# Patient Record
Sex: Female | Born: 1990 | Hispanic: Yes | Marital: Married | Smoking: Never smoker
Health system: Southern US, Community
[De-identification: ages and names within clinical notes are randomized; demographics above are authoritative.]

## PROBLEM LIST (undated history)

## (undated) DIAGNOSIS — A749 Chlamydial infection, unspecified: Secondary | ICD-10-CM

## (undated) DIAGNOSIS — K219 Gastro-esophageal reflux disease without esophagitis: Secondary | ICD-10-CM

## (undated) DIAGNOSIS — F419 Anxiety disorder, unspecified: Secondary | ICD-10-CM

## (undated) HISTORY — DX: Anxiety disorder, unspecified: F41.9

## (undated) HISTORY — PX: LAPAROSCOPIC GASTRIC BAND REMOVAL WITH LAPAROSCOPIC GASTRIC SLEEVE RESECTION: SHX6498

## (undated) HISTORY — PX: CHOLECYSTECTOMY: SHX55

---
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
## 2008-09-02 IMAGING — US US ABDOMEN COMPLETE
1 series · 14 of 25 positions shown · non-contrast
Comparison: None

CLINICAL DATA: Mid abdominal pain, vomiting

ABDOMEN ULTRASOUND
TECHNIQUE: Complete abdominal ultrasound examination was performed
including evaluation of the liver, gallbladder, bile ducts,
pancreas, kidneys, spleen, IVC, and abdominal aorta.

[Series 1: us abdomen complete · 0.32mm/px · 14 of 70 slices shown]
[im 1/70]
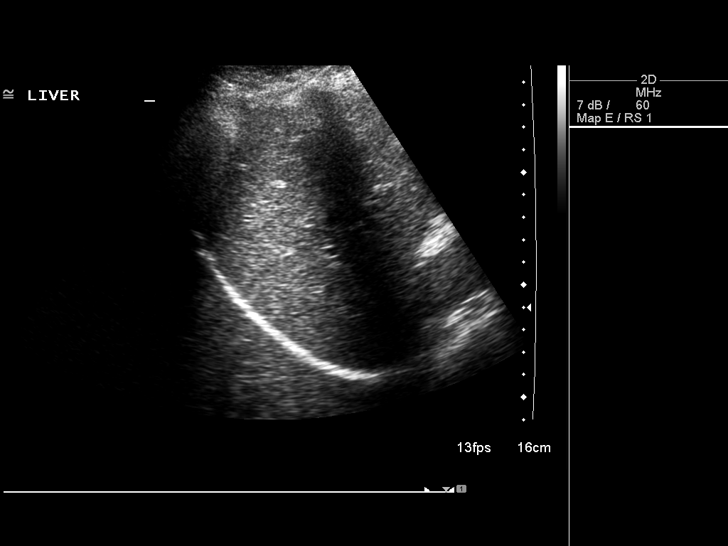
[im 6/70]
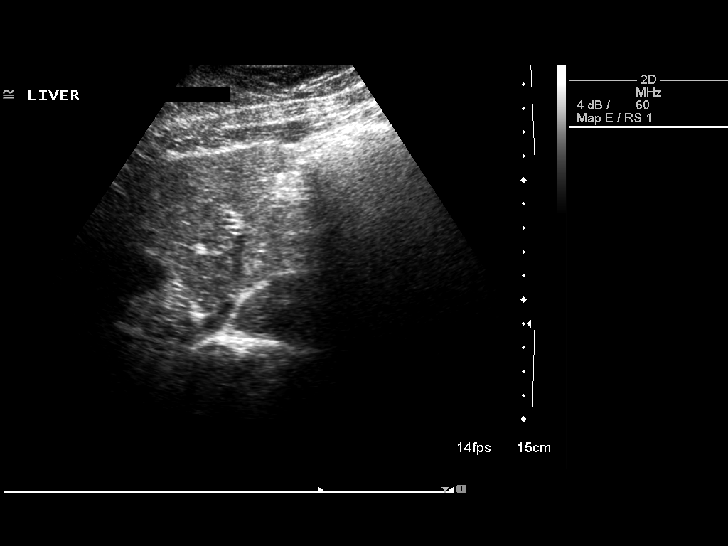
[im 12/70]
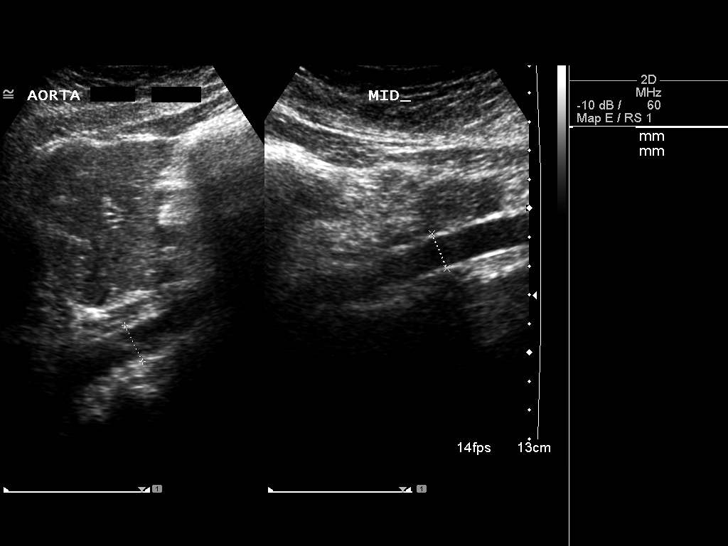
[im 18/70]
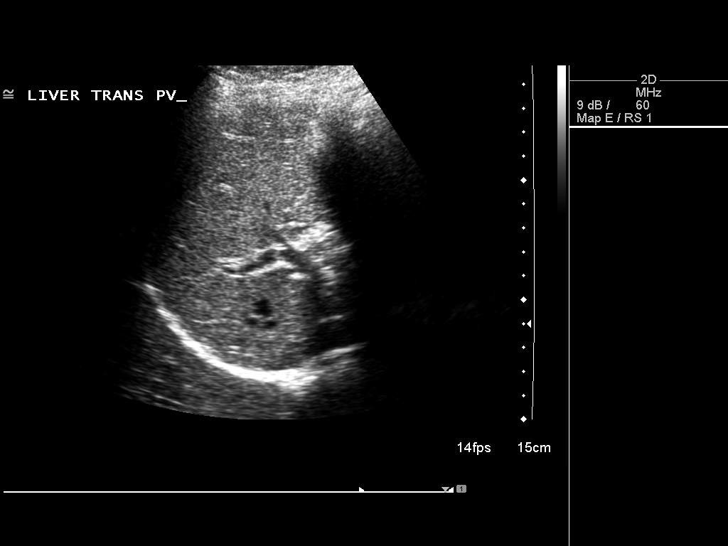
[im 24/70]
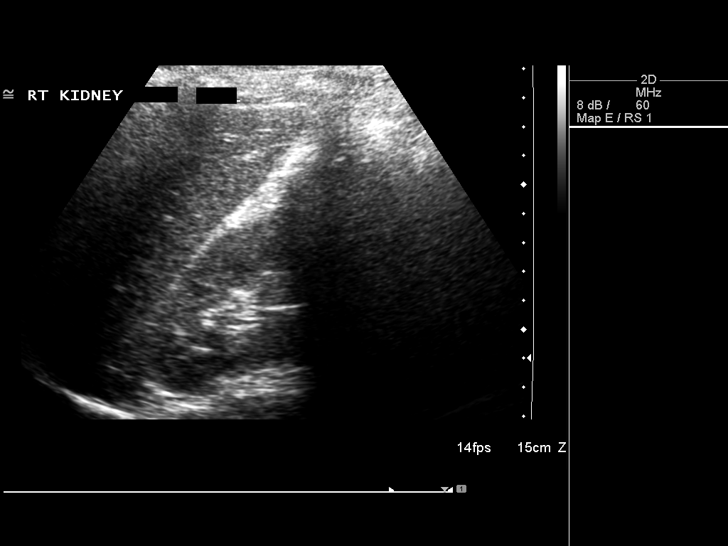
[im 26/70]
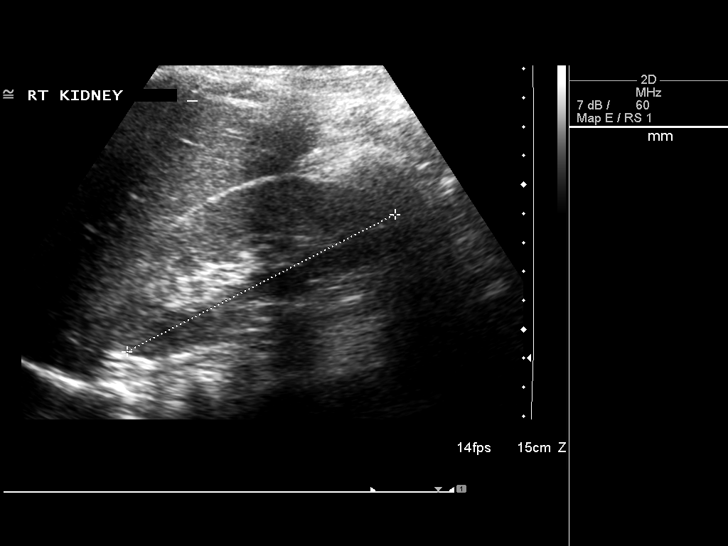
[im 32/70]
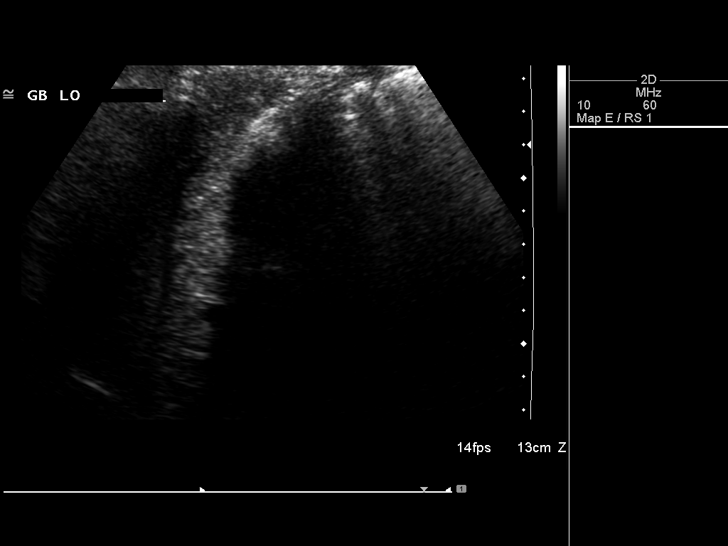
[im 38/70]
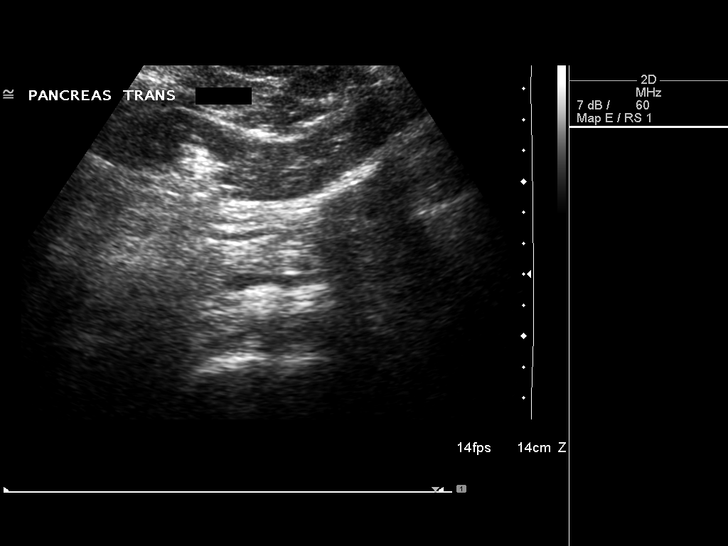
[im 44/70]
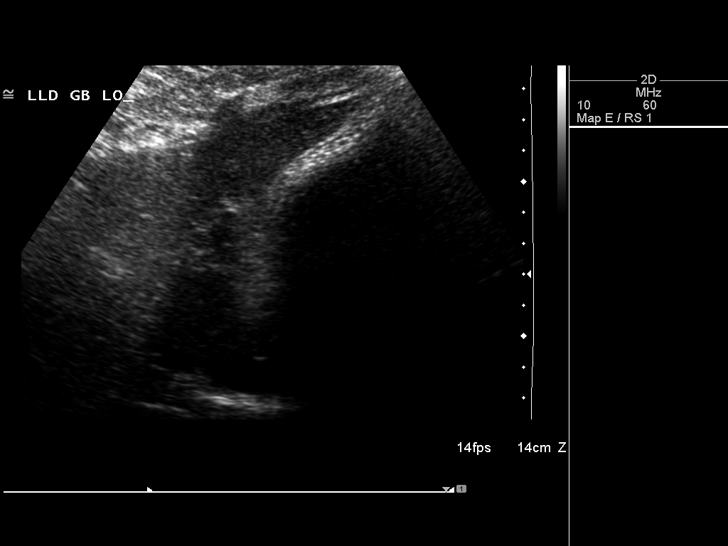
[im 47/70]
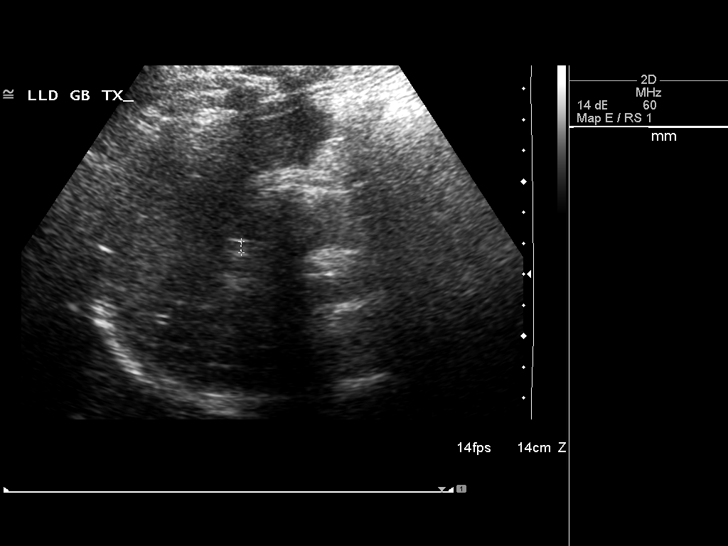
[im 52/70]
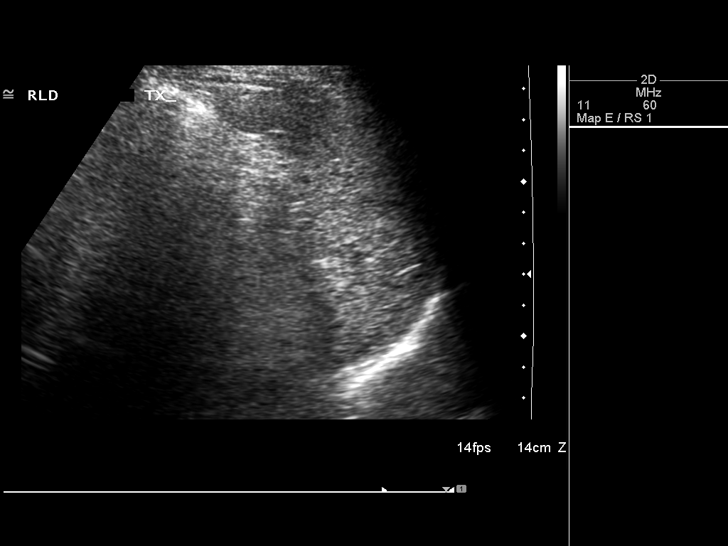
[im 58/70]
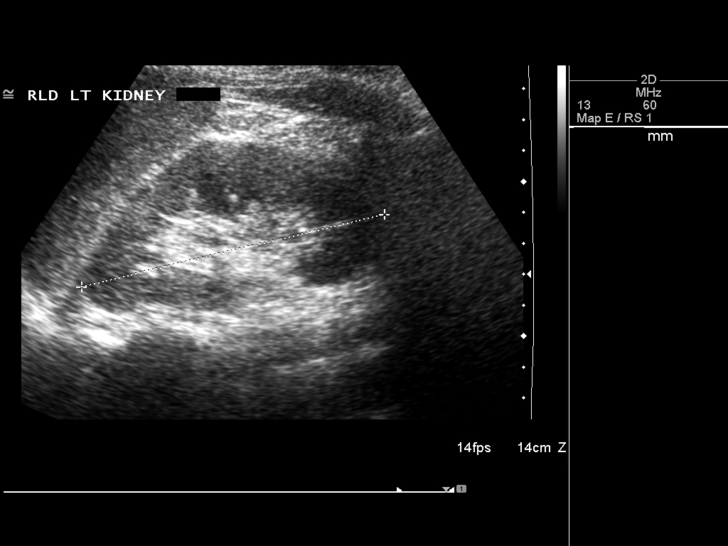
[im 64/70]
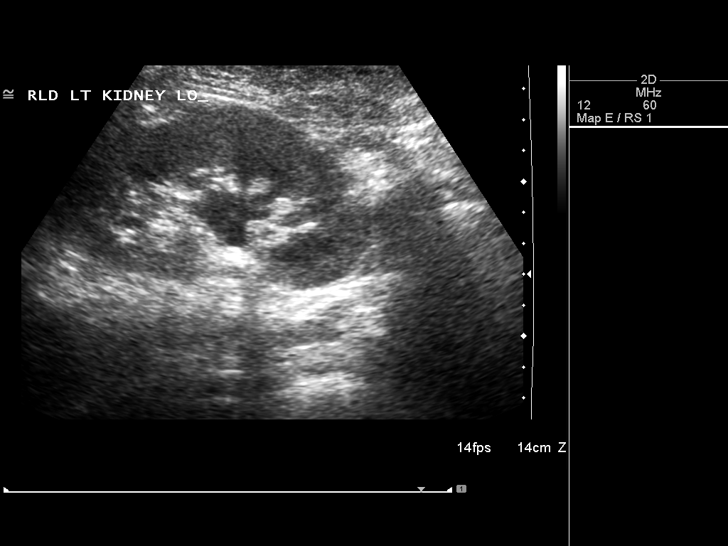
[im 70/70]
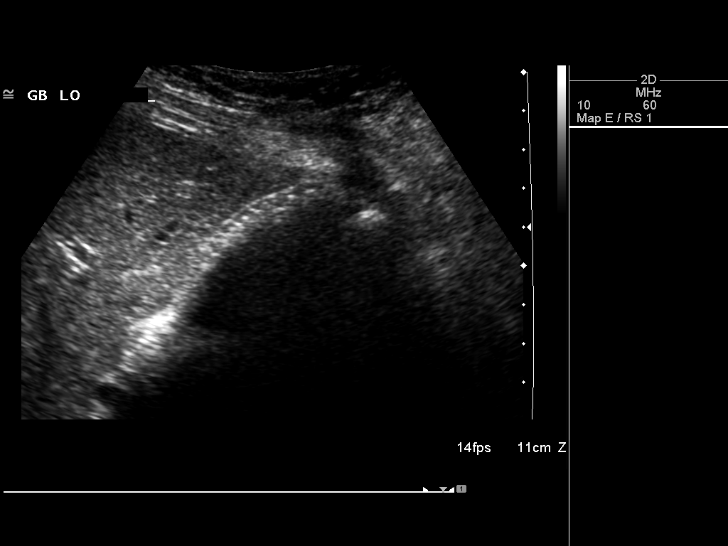

[14 of 25 positions shown; findings below may reference images not displayed]

FINDINGS: Gallbladder is filled with gallstones.  Gallbladder wall
difficult to visualize, but approximately 3 mm in thickness.
Common bile duct normal caliber 3 mm.

Limited visualization of the IVC, pancreas, and abdominal aorta due
to bowel gas.  Visualized liver, spleen, and kidneys unremarkable.
Slight prominence of the left renal pelvis, likely extrarenal
pelvis.
IMPRESSION: Gallbladder filled with gallstones, with borderline thickened wall.

## 2010-08-27 ENCOUNTER — Ambulatory Visit: Payer: Self-pay | Admitting: Gynecology

## 2011-01-21 NOTE — Op Note (Signed)
NAMEJAIANA, SHEFFER NO.:  [id number]   MEDICAL RECORD NO.:  [id number]           PATIENT TYPE:   LOCATION:                                 FACILITY:   PHYSICIAN:  Ollen Gross. Vernell Morgans, M.D. DATE OF BIRTH:  11/05/1990   DATE OF PROCEDURE:  12/18/2007  DATE OF DISCHARGE:                               OPERATIVE REPORT   PREOPERATIVE DIAGNOSIS:  Gallstone pancreatitis.   POSTOPERATIVE DIAGNOSIS:  Gallstone pancreatitis with retained common  duct stone.   PROCEDURE:  Laparoscopic cholecystectomy with intraoperative  cholangiogram.   SURGEON:  Ollen Gross. Vernell Morgans, M.D.   ASSISTANT:  Adolph Pollack, M.D.   ANESTHESIA:  General endotracheal.   DESCRIPTION OF PROCEDURE:  After informed consent was obtained, the  patient was brought to the operating and placed in supine position on  the operating room table.  After induction of general anesthesia, the  patient's abdomen was prepped with Betadine and draped in the usual  sterile manner.  The area below the umbilicus was infiltrated with 0.25%  Marcaine.  A small incision was made with a 15-blade knife.  This  incision was carried down through the subcutaneous tissue bluntly with a  hemostat and [employer] retractors until the linea alba was identified.  The linea alba was incised with a 15-blade knife, and each side was  grasped Kocher clamps and elevated anteriorly.  The preperitoneal space  was then probed bluntly with a hemostat until the peritoneum was opened  and access was gained to the abdominal cavity.  A 0 Vicryl pursestring  stitch was placed in the fascia around the opening, and Hasson cannula  was placed through the opening and anchored in place to the previously  placed Vicryl pursestring stitch.  The abdomen was then insufflated with  carbon dioxide without difficulty.  The patient was placed in the  reverse Trendelenburg position and rotated with the right side up.  A  laparoscope was inserted through  the Hasson cannula, and the right upper  quadrant was inspected.  The dome of the gallbladder and liver were  readily identified.  Next, the epigastric region was infiltrated with  0.25% Marcaine.  A small incision was made with a 15-blade knife and a  10-mm port was placed bluntly through this incision into the abdominal  cavity under direct vision.  Sites were then chosen laterally on the  right side of the area of placement of 5-mm ports.  There areas were  infiltrated with 0.25% Marcaine.  Small stab incisions were made with a  15-blade knife.  The 5-mm ports were then placed bluntly through these  incisions into the abdominal cavity under direct vision.  A blunt  grasper was placed through the lateral-most 5-mm port and used to grasp  the dome of the gallbladder and elevated anteriorly and superiorly.  Another blunt grasper was placed through the other 5-mm port and used to  retract on the body and neck of the gallbladder.  A dissector was placed  through the epigastric port, and using electrocautery, the peritoneal  reflection was opened at the gallbladder neck,  and blunt dissection was  then carried out in this area until the gallbladder neck-cystic duct  junction was readily identified and a good window was created.  A single  clip was placed on the gallbladder neck.  A small ductotomy and made  just below the clip.  A 14-gauge Angiocath was then placed  percutaneously through the anterior abdominal wall under direct vision.  A Reddick cholangiogram catheter was placed through the Angiocath and  flushed.  The Reddick catheter was then placed within the cystic duct  and anchored in place to the clip.  Cholangiogram was obtained that  showed distal common duct filling defect, obstructing flow into the  duodenum, and good length on the cystic duct.  The anchoring clip was  then removed from the patient.  Attempts were made to feed the Reddick  cholangiogram catheter down into the  common duct, but there were valves  that prevented this catheter from getting to there, and rather than  injure the common duct, we decided to remove the Reddick catheter.  Four  clips were placed proximally on the cystic duct, and duct was divided  between the two sets of clips.  Posterior to this, the cystic artery was  identified and again dissected bluntly in a circumferential manner until  a good window was created.  Two clips were placed proximally and  distally on the artery, and the artery was divided between the two.  Next, a laparoscopic hook cautery device was used to separate the  gallbladder from the liver bed.  Prior to completely detaching the  gallbladder from liver bed, the liver bed was inspected.  Several small  bleeding points were coagulated with electrocautery until the area was  completely hemostatic.  The gallbladder was then detached the rest away  from liver bed without difficulty.  A laparoscopic bag was inserted  through the epigastric port.  The gallbladder was placed in the bag, and  the bag was sealed.  The abdomen was then irrigated with copious amounts  of saline until the effluent was clear.  The laparoscope was then moved  to the epigastric port.  A gallbladder grasper was placed through the  Hasson cannula and used the grasper to open the bag.  The bag with the  gallbladder was removed through the infraumbilical port without  difficulty.  The fascial defect was closed with the previously placed  Vicryl pursestring stitch as well as with another figure-of-eight 0  Vicryl stitch.  The rest of ports were then removed under direct vision  and were found to be hemostatic.  The gas was allowed to escape.  The  skin incisions were all closed with interrupted 4-0 Monocryl  subcuticular stitches, and Dermabond dressings were applied.  The  patient tolerated the procedure well.  At the end of the case, all  needle, sponge, and instrument counts were correct.  The  patient was  then awakened and taken to recovery room in stable condition.      Ollen Gross. Vernell Morgans, M.D.  Electronically Signed     PST/MEDQ  D:  12/18/2007  T:  12/19/2007  Job:  [id number]

## 2011-01-21 NOTE — Consult Note (Signed)
[name], ELL NO.:  [id number]   MEDICAL RECORD NO.:  [id number]          PATIENT TYPE:  INP   LOCATION:  5158                         FACILITY:  [facility]   PHYSICIAN:  Ollen Gross. Vernell Morgans, M.D. DATE OF BIRTH:  [birth year]   DATE OF CONSULTATION:  12/18/2007  DATE OF DISCHARGE:                                 CONSULTATION   HISTORY OF PRESENT ILLNESS:  [name] is an 20 year old Hispanic  female with upper abdominal pain that began on Tuesday.  The pain has  been associated with nausea and vomiting, but no fevers.  She was  admitted 2 days ago with apparent gallstone pancreatitis, although, I do  not find evidence of pancreatitis on her lab work since being admitted  to the hospital.  She now feels a little bit better, but still has some  mild ache in her right flank area.  She denies any chest pain, shortness  of breath, diarrhea, or dysuria.   REVIEW OF SYSTEMS:  Rest of her review of system is unremarkable.   PAST MEDICAL HISTORY:  None.   PAST SURGICAL HISTORY:  None.   MEDICATIONS:  None.   ALLERGIES:  None.   SOCIAL HISTORY:  She denies use of alcohol or tobacco products.   FAMILY HISTORY:  Noncontributory.   PHYSICAL EXAMINATION:  GENERAL: She is a well-developed, well-nourished  Hispanic female, in no acute distress.  SKIN: Warm and dry with no jaundice.  EYES: Her extraocular muscle intact.  Pupils are equal, round, and  reactive to light.  Sclerae nonicteric.  LUNGS: Clear bilaterally with no use of accessory respiratory muscles.  HEART: Regular rate and rhythm with impulse in left chest.  ABDOMEN: Soft with some mild epigastric right upper quadrant tenderness,  but no palpable mass or hepatosplenomegaly.  EXTREMITIES: No cyanosis, clubbing, or edema.  Good strength in arms and  legs.  PSYCHOLOGICAL: She is alert and oriented x3 with no significant anxiety  or depression.   LABORATORY DATA:  On review of her lab work, her amylase and  lipase were  normal; white count was normal; and liver functions were okay.  On  review of her ultrasound, she did have stones in her gallbladder, but no  ductal dilatation.   ASSESSMENT AND PLAN:  This is a 20 year old Hispanic female with  possible gallstone pancreatitis and certainly some symptomatic  gallstones.  Because of the risk of further painful episodes and  possible pancreatitis, I  do not think she would benefit from cholecystectomy.  I discussed with  her in detail the risks and benefits of the operation to remove the  gallbladder as well as some of the technical aspects and she understands  and wished to proceed.  We will plan to do this for the next day or so  when the operation is scheduled, and we will also see.      Ollen Gross. Vernell Morgans, M.D.  Electronically Signed     PST/MEDQ  D:  12/18/2007  T:  12/19/2007  Job:  [id number]

## 2011-01-21 NOTE — H&P (Signed)
NAME:  Stacey Cole, LAZARD NO.:  [id number]   MEDICAL RECORD NO.:  [id number]          PATIENT TYPE:  EMS   LOCATION:  MAJO                         FACILITY:  [facility]   PHYSICIAN:  Herbie Saxon, MDDATE OF BIRTH:  1991/01/31   DATE OF ADMISSION:  12/17/2007  DATE OF DISCHARGE:  12/17/2007                              HISTORY & PHYSICAL   PRIMARY CARE PHYSICIAN:  Ursula Beath, MD from [facility].   She is a full code.   PRESENTING COMPLAINT:  Abdominal pain, nausea, and vomiting of 2 days  duration.   HISTORY OF PRESENT ILLNESS:  This is an 20 year old Hispanic lady, who  was seen by her primary care physician yesterday.  She had been  complaining of epigastric abdominal pain, 6-8/10, sharp, radiating to  the back, improved by leaning forward, and increased by eating.  She  vomited about 4-5 times prior to presentation.  She denies any fever or  any dysuria.  No hematemesis or melena stool.  No jaundice.  The patient  was sent by the primary care to have abdominal ultrasound, which did  show gallstones and inflamed pancreas, and lipase and LFTs were also  elevated.   PAST MEDICAL HISTORY:  Gastroesophageal reflux disease.   MEDICATIONS:  Prilosec OTC 20 mg daily.   ALLERGIES:  No known drug allergies.   FAMILY HISTORY:  Noncontributory.   PAST SURGICAL HISTORY:  Nil of note.   SOCIAL HISTORY:  The patient is single.   GYNECOLOGIC HISTORY:  Her last menstrual period is December 02, 2007.   REVIEW OF SYSTEMS:  Fourteen systems reviewed, pertinent and positive  other than history of presenting complaint.   PHYSICAL EXAMINATION:  GENERAL:  On examination, she is a young lady in  no acute distress.  VITAL SIGNS:  Temperature 98.4, pulse 80, respiratory rate is 20, and  blood pressure 120/70.  HEENT:  Pupils equally round and reactive to light and accommodation.  NECK:  Supple.  Oropharynx and nasopharynx are clear.  Mucous  membrane  are moist.  There is no carotid bruit.  No elevated thyroid.  No  submandibular lymphadenopathy.  CHEST:  Clinically clear.  HEART:  Sounds 1 and 2.  Regular rate and rhythm.  No murmurs.  ABDOMEN:  Mild epigastric tenderness.  No organomegaly.  Bowel sounds  are normoactive.  NEUROLOGIC:  He is alert and oriented to time, person, and place.  EXTREMITIES:  Power is 5+ globally.  Deep tendon reflexes are 2+  globally.  Peripheral pulses present.  No pedal edema.   LABS:  Pending.   ASSESSMENT:  Gallstone pancreatitis.   The patient is to be admitted to the medical floor.  She is to be NPO  until GI Surgery evaluate her in the a.m.  Surgeon will be Dr. Colin Benton.  IV fluids, D5 half normal saline at 80 mL an hour.  We will check  coagulation parameter, complete blood count, complete chemistry, chest x-  ray, EKG, urinalysis, and fasting lipids.  Lipase and amylase will also  be followed serially.  Oxygen 2-4 L by nasal cannula to keep  sats  greater than 90%.  The patient is also to be on SCD boots for DVT  prophylaxis.  Phenergan alternating Reglan p.r.n. for nausea.  Protonix  40 mg IV daily, loading 0.5-1 mg IV q. 4 h. p.r.n. for severe pain.      Herbie Saxon, MD  Electronically Signed     MIO/MEDQ  D:  12/17/2007  T:  12/18/2007  Job:  [id number]   cc:   Alfonse Ras, MD  Ursula Beath, MD

## 2011-01-21 NOTE — Consult Note (Signed)
[name], MASK NO.:  [id number]   MEDICAL RECORD NO.:  [id number]           PATIENT TYPE:   LOCATION:                                 FACILITY:   PHYSICIAN:  Stacey Cole, MDDATE OF BIRTH:  Oct 01, 1990   DATE OF CONSULTATION:  DATE OF DISCHARGE:                                 CONSULTATION   REASON FOR CONSULTATION:  Bile duct stone.   REQUESTING PHYSICIAN:  Dr. Carolynne Cole.   HISTORY OF PRESENT ILLNESS:  [name] is an 20 year old Hispanic  female who just came out of surgery for laparoscopic cholecystectomy  intraoperative cholangiogram.  During intraoperative cholangiogram, she  was found to have a common bile duct stone that could not be removed  during surgery.  She was admitted on December 17, 2007, secondary to  abdominal pain, nausea, vomiting of 2 days duration.  Her pain was  concerning for cholecystitis and she underwent an abdominal ultrasound  which did show gallstones elevated liver function tests and inflamed  pancreas.  On presentation here, her bilirubin was 0.8, ALP 124, AST 44,  ALT 123.  Her amylase 92 and lipase is 24.  Her abdominal pain prior to  admission was epigastric in nature, sharp, radiating in to her back  improved with leaning forward  and worsened with eating.  She also had  several episodes of vomiting prior to admission.  Currently she is in  post anesthesia care unit status post laparoscopic cholecystectomy and  intraoperative cholangiogram.   PAST MEDICAL HISTORY:  Reflux.   MEDICATIONS:  Prilosec OTC 20 mg p.o. daily   ALLERGIES:  No known drug allergies.   FAMILY HISTORY:  Noncontributory.   PHYSICAL EXAMINATION:  VITAL SIGNS:  Temperature 98.6, pulse  81, blood  pressure 113/71.  GENERAL:  Lethargic but arousable, in no acute distress.  ABDOMEN:  Tender, but only light palpation done due to recent surgery,  soft, mild distention, and positive bowel sounds.   LABORATORY:  Her white blood count 8.1,  hemoglobin 12.8, platelet counts  is 271,000.   IMPRESSION:  An 20 year old Hispanic female status post laparoscopic  cholecystectomy secondary to cholecystitis with possible intraoperative  cholangiogram for a common bile duct stone.  She is having endoscopic  retrograde cholangiopancreatography done to further evaluate and remove  any potential stones in the common bile duct.  I discussed risks,  benefits of the endoscopic retrograde cholangiopancreatogram with her  and she agrees to proceed.   PLAN:  This procedure will be on December 19, 2007, and will be done by Dr.  Everardo Cole. Stacey Cole.  Will follow LFTs in the morning.      Stacey Friar, MD  Electronically Signed     VCS/MEDQ  D:  12/18/2007  T:  12/19/2007  Job:  [id number]

## 2011-01-21 NOTE — Op Note (Signed)
Stacey Cole, HEBER NO.:  [id number]   MEDICAL RECORD NO.:  [id number]          PATIENT TYPE:  INP   LOCATION:  5158                         FACILITY:  [facility]   PHYSICIAN:  John C. Madilyn Fireman, M.D.    DATE OF BIRTH:  23-Aug-1991   DATE OF PROCEDURE:  12/19/2007  DATE OF DISCHARGE:                               OPERATIVE REPORT   PROCEDURE:  Endoscopic retrograde cholangiopancreatography with  sphincterotomy.   INDICATION FOR PROCEDURE:  Suspected common bile duct stone on  intraoperative cholangiogram.   PROCEDURE IN DETAIL:  The patient was placed in the left lateral  decubitus position and placed on the pulse monitor with continuous low-  flow oxygen delivered by nasal cannula.  She was sedated with 100 mcg IV  fentanyl and 7 mg IV Versed.  Olympus video side-viewing endoscope was  advanced blindly into the oropharynx, esophagus, and stomach.  The  pylorus was traversed and the papilla of Vater located on the medial  duodenal wall, had a normal appearance, was cannulated with Wilson-Cook  sphincterotome.  There was one in brief injection of the pancreatic duct  prior to selective cannulation of common bile duct.  The duct looked  moderately dilated but no filling defects were seen.  A sphincterotomy  was performed and a 15-mm balloon catheter advanced high into the common  hepatic duct and inflated and dragged through the papilla twice with no  stones seen to be delivered.  The scope was then withdrawn and the  patient returned to the recovery room in stable condition.  She  tolerated the procedure well.  There were no immediate complications.   IMPRESSION:  Mildly dilated common bile duct with no filling defects  seen, status post empiric sphincterotomy and balloon sweep.   PLAN:  Advance diet as tolerated and routine post surgical care.           ______________________________  Everardo All. Madilyn Fireman, M.D.     JCH/MEDQ  D:  12/19/2007  T:  12/20/2007  Job:   [id number]   cc:   Ollen Gross. Vernell Morgans, M.D.

## 2011-01-24 NOTE — Discharge Summary (Signed)
Stacey Cole, Stacey Cole NO.:  [id number]   MEDICAL RECORD NO.:  [id number]          PATIENT TYPE:  INP   LOCATION:  5158                         FACILITY:  [facility]   PHYSICIAN:  Herbie Saxon, MDDATE OF BIRTH:  Jul 17, 1991   DATE OF ADMISSION:  12/16/2007  DATE OF DISCHARGE:  12/20/2007                               DISCHARGE SUMMARY   DISCHARGE DIAGNOSES:  1. Cholelithiasis.  2. Common bile duct stone status post endoscopic retrograde      cholangiopancreatogram.  3. Cholecystitis.   CONSULTS:  1. Dr. Carolynne Edouard, General Surgery.  2. Dr. Bosie Clos, Gastroenterology.   PROCEDURE:  Cholecystectomy.   HOSPITAL COURSE:  This 20 year old female presented with abdominal pain,  heart burn, nausea, and vomiting of 2 days.  On presentation, her  transaminases were elevated as well as elevated lipase.  His abdominal  ultrasound showed gallstones, so we thought that the patient would  benefit from laparoscopic cholecystectomy, and this was performed on  December 18, 2007, without complications.  Surgery performed by Dr. Carolynne Edouard.  She was also seen by Dr. Bosie Clos of [facility].  An ERCP  was done.  It did not show any stone at that time with a 15-mm balloon  sweep performed.  The patient's elevated liver function tests and lipase  improved.  She was discharged on December 20, 2007, secondary to  laparoscopic cholecystectomy.   DISCHARGE CONDITION:  Stable.   DIET:  Low cholesterol.   ACTIVITY:  No restrictions.   FOLLOWUP:  Follow up with primary care physician Dr. Raquel James in 1 week.  Follow up with Dr. Carolynne Edouard again in 2-3 weeks.   MEDICATIONS ON DISCHARGE:  1. Prilosec 20 mg daily.  2. Vicodin 5/500 mg 1-2 tabs q.6 h. p.r.n.  3. Tylenol 650 mg q.6 h. p.r.n. for fever 1 tablet daily.   PHYSICAL EXAMINATION:  GENERAL:  She is a young girl in no acute  distress.  VITAL SIGNS:  Temperature 98, pulse 82, respiratory rate 18, and blood  pressure 100/70.  HEENT:   Pupils are equal and reactive to light and accommodation.  Extraocular muscles are intact.  Oropharynx and nasopharynx are clear.  NECK:  Supple.  ABDOMEN:  Surgical site in the right upper quadrant with  minimal  tenderness.  Bowel sounds are normoactive.  Inguinal orifices are  patent.  HEART:  Heart sounds 1 and 2 regular.  NEUROLOGIC:  She is alert and oriented to time, place, and person.  EXTREMITIES:  Peripheral pulses present.  No pedal edema.  NEUROLOGICAL:  Cranial nerves II through XII intact.  Power is 5 in all  limbs.  Deep tendon reflexes are 2+ in all limbs.   FOLLOWUP LABS:  AST 59, ALT 87, and lipase 17.      Herbie Saxon, MD  Electronically Signed     MIO/MEDQ  D:  01/18/2008  T:  01/19/2008  Job:  [id number]

## 2011-06-03 LAB — COMPREHENSIVE METABOLIC PANEL
AST: 44 — ABNORMAL HIGH
Albumin: 4.2
Alkaline Phosphatase: 124 — ABNORMAL HIGH
Chloride: 101
GFR calc Af Amer: >60
Potassium: 3.5
Sodium: 134 — ABNORMAL LOW
Total Bilirubin: 0.8
Total Protein: 7.7

## 2011-06-03 LAB — LIPID PANEL
Cholesterol: 145
LDL Cholesterol: 93
Total CHOL/HDL Ratio: 3.6
Triglycerides: 59
VLDL: 12

## 2011-06-03 LAB — CBC
HCT: 32 — ABNORMAL LOW
HCT: 36.4
Hemoglobin: 11 — ABNORMAL LOW
Hemoglobin: 12.8
MCHC: 34.4
MCHC: 35.3
MCV: 86.3
Platelets: 271
Platelets: 282
RBC: 3.7 — ABNORMAL LOW
RDW: 14.2
RDW: 14.4
WBC: 8.4

## 2011-06-03 LAB — HEPATIC FUNCTION PANEL
ALT: 87 — ABNORMAL HIGH
AST: 59 — ABNORMAL HIGH
Albumin: 3.3 — ABNORMAL LOW
Alkaline Phosphatase: 89
Bilirubin, Direct: 0.1
Bilirubin, Direct: 0.1
Indirect Bilirubin: 0.7
Total Bilirubin: 0.7
Total Bilirubin: 0.8

## 2011-06-03 LAB — AMYLASE: Amylase: 92

## 2011-06-03 LAB — BASIC METABOLIC PANEL
BUN: 6
CO2: 25
CO2: 26
Calcium: 8.5
Calcium: 9.4
Chloride: 102
GFR calc Af Amer: >60
GFR calc non Af Amer: >60
Glucose, Bld: 84
Glucose, Bld: 98
Potassium: 3.4 — ABNORMAL LOW
Potassium: 3.5
Sodium: 134 — ABNORMAL LOW

## 2011-06-03 LAB — HEPATITIS C ANTIBODY: HCV Ab: NEGATIVE

## 2011-06-03 LAB — TYPE AND SCREEN: Antibody Screen: NEGATIVE

## 2013-04-19 LAB — OB RESULTS CONSOLE HGB/HCT, BLOOD
HCT: 40 %
Hemoglobin: 13.8 g/dL

## 2013-04-19 LAB — OB RESULTS CONSOLE HIV ANTIBODY (ROUTINE TESTING): HIV: NONREACTIVE

## 2013-04-19 LAB — OB RESULTS CONSOLE RUBELLA ANTIBODY, IGM: RUBELLA: NON-IMMUNE/NOT IMMUNE

## 2013-04-19 LAB — OB RESULTS CONSOLE RPR: RPR: NONREACTIVE

## 2013-04-19 LAB — OB RESULTS CONSOLE GC/CHLAMYDIA: GC PROBE AMP, GENITAL: NEGATIVE

## 2013-04-19 LAB — OB RESULTS CONSOLE ABO/RH: RH TYPE: POSITIVE

## 2013-04-19 LAB — OB RESULTS CONSOLE PLATELET COUNT: PLATELETS: 352 10*3/uL

## 2013-04-19 LAB — OB RESULTS CONSOLE HEPATITIS B SURFACE ANTIGEN: Hepatitis B Surface Ag: NEGATIVE

## 2013-09-08 NOTE — L&D Delivery Note (Signed)
Delivery Note At 3:53 PM a viable female was delivered via Vaginal, Spontaneous Delivery (Presentation: ;  ).  APGAR: , ; weight .   Placenta status: Intact, Spontaneous.  Cord:loose nuchal X 1, reduced   with the following complications: .  Cord pH: sent, pending  Anesthesia:epid   Episiotomy: none Lacerations: sec deg Suture Repair: 3.0 vicryl rapide Est. Blood Loss (mL): 300  Mom to postpartum.  Baby to Nursery.  Meriel PicaHOLLAND,Avi Kerschner M 12/02/2013, 4:12 PM

## 2013-11-09 LAB — OB RESULTS CONSOLE GBS: GBS: NEGATIVE

## 2013-11-14 LAB — OB RESULTS CONSOLE GC/CHLAMYDIA
Chlamydia: POSITIVE
Chlamydia: POSITIVE

## 2013-12-02 ENCOUNTER — Encounter (HOSPITAL_COMMUNITY): Payer: Self-pay | Admitting: *Deleted

## 2013-12-02 ENCOUNTER — Inpatient Hospital Stay (HOSPITAL_COMMUNITY)
Admission: AD | Admit: 2013-12-02 | Discharge: 2013-12-04 | DRG: 775 | Disposition: A | Discharge status: Home / Self Care | Payer: BC Managed Care – PPO | Source: Ambulatory Visit | Attending: Obstetrics and Gynecology | Admitting: Obstetrics and Gynecology

## 2013-12-02 ENCOUNTER — Inpatient Hospital Stay (HOSPITAL_COMMUNITY): Payer: BC Managed Care – PPO | Admitting: Anesthesiology

## 2013-12-02 ENCOUNTER — Encounter (HOSPITAL_COMMUNITY): Payer: BC Managed Care – PPO | Admitting: Anesthesiology

## 2013-12-02 DIAGNOSIS — O99214 Obesity complicating childbirth: Secondary | ICD-10-CM

## 2013-12-02 DIAGNOSIS — K219 Gastro-esophageal reflux disease without esophagitis: Secondary | ICD-10-CM | POA: Diagnosis present

## 2013-12-02 DIAGNOSIS — Z3490 Encounter for supervision of normal pregnancy, unspecified, unspecified trimester: Secondary | ICD-10-CM

## 2013-12-02 DIAGNOSIS — IMO0001 Reserved for inherently not codable concepts without codable children: Secondary | ICD-10-CM

## 2013-12-02 DIAGNOSIS — E669 Obesity, unspecified: Secondary | ICD-10-CM | POA: Diagnosis present

## 2013-12-02 HISTORY — DX: Gastro-esophageal reflux disease without esophagitis: K21.9

## 2013-12-02 HISTORY — DX: Chlamydial infection, unspecified: A74.9

## 2013-12-02 LAB — CBC
HEMATOCRIT: 36.7 % (ref 36.0–46.0)
HEMOGLOBIN: 12.7 g/dL (ref 12.0–15.0)
MCH: 28.3 pg (ref 26.0–34.0)
MCHC: 34.6 g/dL (ref 30.0–36.0)
MCV: 81.7 fL (ref 78.0–100.0)
Platelets: 254 10*3/uL (ref 150–400)
RBC: 4.49 MIL/uL (ref 3.87–5.11)
RDW: 14.7 % (ref 11.5–15.5)
WBC: 10.4 10*3/uL (ref 4.0–10.5)

## 2013-12-02 LAB — RPR: RPR Ser Ql: NONREACTIVE

## 2013-12-02 LAB — TYPE AND SCREEN
ABO/RH(D): A POS
ANTIBODY SCREEN: NEGATIVE

## 2013-12-02 LAB — ABO/RH: ABO/RH(D): A POS

## 2013-12-02 MED ORDER — LACTATED RINGERS IV SOLN: 500.0000 mL | Freq: Once | INTRAVENOUS | Status: DC

## 2013-12-02 MED ORDER — WITCH HAZEL-GLYCERIN EX PADS: 1.0000 "application " | MEDICATED_PAD | CUTANEOUS | Status: DC | PRN

## 2013-12-02 MED ORDER — IBUPROFEN 600 MG PO TABS: 600.0000 mg | ORAL_TABLET | Freq: Four times a day (QID) | ORAL | Status: DC | PRN

## 2013-12-02 MED ORDER — EPHEDRINE 5 MG/ML INJ
INTRAVENOUS | Status: AC
  Filled 2013-12-02: qty 4

## 2013-12-02 MED ORDER — ZOLPIDEM TARTRATE 5 MG PO TABS: 5.0000 mg | ORAL_TABLET | Freq: Every evening | ORAL | Status: DC | PRN

## 2013-12-02 MED ORDER — LACTATED RINGERS IV SOLN: 500.0000 mL | INTRAVENOUS | Status: DC | PRN

## 2013-12-02 MED ORDER — PHENYLEPHRINE 40 MCG/ML (10ML) SYRINGE FOR IV PUSH (FOR BLOOD PRESSURE SUPPORT)
PREFILLED_SYRINGE | INTRAVENOUS | Status: DC
Start: 2013-12-02 — End: 2013-12-02
  Filled 2013-12-02: qty 10

## 2013-12-02 MED ORDER — CITRIC ACID-SODIUM CITRATE 334-500 MG/5ML PO SOLN: 30.0000 mL | ORAL | Status: DC | PRN

## 2013-12-02 MED ORDER — MEASLES, MUMPS & RUBELLA VAC ~~LOC~~ INJ: 0.5000 mL | INJECTION | Freq: Once | SUBCUTANEOUS | Status: DC

## 2013-12-02 MED ORDER — SERTRALINE HCL 50 MG PO TABS
50.0000 mg | ORAL_TABLET | Freq: Every day | ORAL | Status: DC
  Administered 2013-12-04: 50 mg via ORAL
  Filled 2013-12-02 (×3): qty 1

## 2013-12-02 MED ORDER — BUTORPHANOL TARTRATE 1 MG/ML IJ SOLN: 1.0000 mg | INTRAMUSCULAR | Status: DC | PRN

## 2013-12-02 MED ORDER — FENTANYL 2.5 MCG/ML BUPIVACAINE 1/10 % EPIDURAL INFUSION (WH - ANES): 14.0000 mL/h | INTRAMUSCULAR | Status: DC | PRN

## 2013-12-02 MED ORDER — LIDOCAINE HCL (PF) 1 % IJ SOLN
30.0000 mL | INTRAMUSCULAR | Status: DC | PRN
  Administered 2013-12-02: 30 mL via SUBCUTANEOUS
  Filled 2013-12-02: qty 30

## 2013-12-02 MED ORDER — SENNOSIDES-DOCUSATE SODIUM 8.6-50 MG PO TABS
2.0000 | ORAL_TABLET | ORAL | Status: DC
  Administered 2013-12-03 (×2): 2 via ORAL
  Filled 2013-12-02 (×2): qty 2

## 2013-12-02 MED ORDER — PRENATAL MULTIVITAMIN CH
1.0000 | ORAL_TABLET | Freq: Every day | ORAL | Status: DC
  Administered 2013-12-03 – 2013-12-04 (×2): 1 via ORAL
  Filled 2013-12-02 (×2): qty 1

## 2013-12-02 MED ORDER — PHENYLEPHRINE 40 MCG/ML (10ML) SYRINGE FOR IV PUSH (FOR BLOOD PRESSURE SUPPORT): 80.0000 ug | PREFILLED_SYRINGE | INTRAVENOUS | Status: DC | PRN

## 2013-12-02 MED ORDER — FENTANYL 2.5 MCG/ML BUPIVACAINE 1/10 % EPIDURAL INFUSION (WH - ANES)
INTRAMUSCULAR | Status: DC | PRN
  Administered 2013-12-02: 14 mL/h via EPIDURAL

## 2013-12-02 MED ORDER — ONDANSETRON HCL 4 MG/2ML IJ SOLN: 4.0000 mg | INTRAMUSCULAR | Status: DC | PRN

## 2013-12-02 MED ORDER — IBUPROFEN 800 MG PO TABS
800.0000 mg | ORAL_TABLET | Freq: Three times a day (TID) | ORAL | Status: DC | PRN
  Administered 2013-12-02 – 2013-12-04 (×4): 800 mg via ORAL
  Filled 2013-12-02 (×4): qty 1

## 2013-12-02 MED ORDER — BENZOCAINE-MENTHOL 20-0.5 % EX AERO
1.0000 "application " | INHALATION_SPRAY | CUTANEOUS | Status: DC | PRN
  Administered 2013-12-02: 1 via TOPICAL
  Filled 2013-12-02 (×2): qty 56

## 2013-12-02 MED ORDER — SIMETHICONE 80 MG PO CHEW: 80.0000 mg | CHEWABLE_TABLET | ORAL | Status: DC | PRN

## 2013-12-02 MED ORDER — LANOLIN HYDROUS EX OINT: TOPICAL_OINTMENT | CUTANEOUS | Status: DC | PRN

## 2013-12-02 MED ORDER — ACETAMINOPHEN 325 MG PO TABS
650.0000 mg | ORAL_TABLET | ORAL | Status: DC | PRN
Start: 2013-12-02 — End: 2013-12-02

## 2013-12-02 MED ORDER — OXYCODONE-ACETAMINOPHEN 5-325 MG PO TABS: 1.0000 | ORAL_TABLET | ORAL | Status: DC | PRN

## 2013-12-02 MED ORDER — OXYTOCIN 40 UNITS IN LACTATED RINGERS INFUSION - SIMPLE MED
62.5000 mL/h | INTRAVENOUS | Status: DC
  Filled 2013-12-02: qty 1000

## 2013-12-02 MED ORDER — OXYCODONE-ACETAMINOPHEN 5-325 MG PO TABS
1.0000 | ORAL_TABLET | Freq: Four times a day (QID) | ORAL | Status: DC | PRN
  Administered 2013-12-03: 1 via ORAL
  Filled 2013-12-02: qty 1

## 2013-12-02 MED ORDER — EPHEDRINE 5 MG/ML INJ: 10.0000 mg | INTRAVENOUS | Status: DC | PRN

## 2013-12-02 MED ORDER — TETANUS-DIPHTH-ACELL PERTUSSIS 5-2.5-18.5 LF-MCG/0.5 IM SUSP
0.5000 mL | Freq: Once | INTRAMUSCULAR | Status: DC
  Filled 2013-12-02: qty 0.5

## 2013-12-02 MED ORDER — DIPHENHYDRAMINE HCL 50 MG/ML IJ SOLN: 12.5000 mg | INTRAMUSCULAR | Status: DC | PRN

## 2013-12-02 MED ORDER — FLEET ENEMA 7-19 GM/118ML RE ENEM: 1.0000 | ENEMA | Freq: Every day | RECTAL | Status: DC | PRN

## 2013-12-02 MED ORDER — DIPHENHYDRAMINE HCL 25 MG PO CAPS: 25.0000 mg | ORAL_CAPSULE | Freq: Four times a day (QID) | ORAL | Status: DC | PRN

## 2013-12-02 MED ORDER — LACTATED RINGERS IV SOLN: INTRAVENOUS | Status: DC

## 2013-12-02 MED ORDER — DIBUCAINE 1 % RE OINT
1.0000 "application " | TOPICAL_OINTMENT | RECTAL | Status: DC | PRN
  Filled 2013-12-02: qty 28

## 2013-12-02 MED ORDER — FENTANYL 2.5 MCG/ML BUPIVACAINE 1/10 % EPIDURAL INFUSION (WH - ANES)
INTRAMUSCULAR | Status: AC
  Filled 2013-12-02: qty 125

## 2013-12-02 MED ORDER — LIDOCAINE HCL (PF) 1 % IJ SOLN
INTRAMUSCULAR | Status: DC | PRN
  Administered 2013-12-02 (×2): 4 mL

## 2013-12-02 MED ORDER — BISACODYL 10 MG RE SUPP
10.0000 mg | Freq: Every day | RECTAL | Status: DC | PRN
  Filled 2013-12-02: qty 1

## 2013-12-02 MED ORDER — OXYTOCIN BOLUS FROM INFUSION
500.0000 mL | INTRAVENOUS | Status: DC
  Administered 2013-12-02: 500 mL via INTRAVENOUS

## 2013-12-02 MED ORDER — ONDANSETRON HCL 4 MG PO TABS
4.0000 mg | ORAL_TABLET | ORAL | Status: DC | PRN
Start: 2013-12-02 — End: 2013-12-04

## 2013-12-02 MED ORDER — ONDANSETRON HCL 4 MG/2ML IJ SOLN: 4.0000 mg | Freq: Four times a day (QID) | INTRAMUSCULAR | Status: DC | PRN

## 2013-12-02 NOTE — MAU Note (Signed)
UC's since last night. No leaking or bleeding.

## 2013-12-02 NOTE — Anesthesia Preprocedure Evaluation (Signed)
Anesthesia Evaluation  Patient identified by MRN, date of birth, ID band Patient awake    Reviewed: Allergy & Precautions, H&P , Patient's Chart, lab work & pertinent test results  Airway Mallampati: III TM Distance: >3 FB Neck ROM: Full    Dental no notable dental hx. (+) Teeth Intact   Pulmonary neg pulmonary ROS,  breath sounds clear to auscultation  Pulmonary exam normal       Cardiovascular negative cardio ROS  Rhythm:Regular Rate:Normal     Neuro/Psych negative neurological ROS  negative psych ROS   GI/Hepatic Neg liver ROS, GERD-  Medicated and Controlled,  Endo/Other  Obesity  Renal/GU negative Renal ROS  negative genitourinary   Musculoskeletal negative musculoskeletal ROS (+)   Abdominal   Peds  Hematology negative hematology ROS (+)   Anesthesia Other Findings   Reproductive/Obstetrics (+) Pregnancy                           Anesthesia Physical Anesthesia Plan  ASA: II  Anesthesia Plan: Epidural   Post-op Pain Management:    Induction:   Airway Management Planned: Natural Airway  Additional Equipment:   Intra-op Plan:   Post-operative Plan:   Informed Consent: I have reviewed the patients History and Physical, chart, labs and discussed the procedure including the risks, benefits and alternatives for the proposed anesthesia with the patient or authorized representative who has indicated his/her understanding and acceptance.     Plan Discussed with: Anesthesiologist  Anesthesia Plan Comments:         Anesthesia Quick Evaluation

## 2013-12-02 NOTE — Anesthesia Procedure Notes (Signed)
Epidural Patient location during procedure: OB Start time: 12/02/2013 12:30 PM  Staffing Anesthesiologist: Sevana Grandinetti A. Performed by: anesthesiologist   Preanesthetic Checklist Completed: patient identified, site marked, surgical consent, pre-op evaluation, timeout performed, IV checked, risks and benefits discussed and monitors and equipment checked  Epidural Patient position: sitting Prep: site prepped and draped and DuraPrep Patient monitoring: continuous pulse ox and blood pressure Approach: midline Injection technique: LOR air  Needle:  Needle type: Tuohy  Needle gauge: 17 G Needle length: 9 cm and 9 Needle insertion depth: 7 cm Catheter type: closed end flexible Catheter size: 19 Gauge Catheter at skin depth: 12 cm Test dose: negative and Other  Assessment Events: blood not aspirated, injection not painful, no injection resistance, negative IV test and no paresthesia  Additional Notes Patient identified. Risks and benefits discussed including failed block, incomplete  Pain control, post dural puncture headache, nerve damage, paralysis, blood pressure Changes, nausea, vomiting, reactions to medications-both toxic and allergic and post Partum back pain. All questions were answered. Patient expressed understanding and wished to proceed. Sterile technique was used throughout procedure. Epidural site was Dressed with sterile barrier dressing. No paresthesias, signs of intravascular injection Or signs of intrathecal spread were encountered.  Patient was more comfortable after the epidural was dosed. Please see RN's note for documentation of vital signs and FHR which are stable.

## 2013-12-02 NOTE — Progress Notes (Signed)
Now 4-5?c?AROM>>>thin mec, requests epidural

## 2013-12-02 NOTE — H&P (Signed)
Stacey Cole is a 23 y.o. female presenting for SOL. Maternal Medical History:  Reason for admission: Contractions.   Contractions: Onset was 3-5 hours ago.   Frequency: regular.   Perceived severity is moderate.    Fetal activity: Perceived fetal activity is normal.   Last perceived fetal movement was within the past hour.      OB History   Grav Para Term Preterm Abortions TAB SAB Ect Mult Living   2    1 1     0     Past Medical History  Diagnosis Date  . Chlamydia   . GERD (gastroesophageal reflux disease)    Past Surgical History  Procedure Laterality Date  . Cholecystectomy     Family History: family history is not on file. Social History:  reports that she has never smoked. She has never used smokeless tobacco. She reports that she does not drink alcohol or use illicit drugs.   Prenatal Transfer Tool  Maternal Diabetes: No Genetic Screening: Normal Maternal Ultrasounds/Referrals: Normal Fetal Ultrasounds or other Referrals:  None Maternal Substance Abuse:  No Significant Maternal Medications:  None Significant Maternal Lab Results:  None Other Comments:  None  ROS  Dilation: 3.5 Effacement (%): 90 Station: -2 Exam by:: S. Carrera, RNC Blood pressure 125/68, pulse 58, temperature 98.3 F (36.8 C), temperature source Oral, resp. rate 18, height 5\' 5"  (1.651 m), weight 230 lb (104.327 kg). Maternal Exam:  Uterine Assessment: Contraction strength is moderate.  Contraction frequency is regular.   Abdomen: Patient reports no abdominal tenderness. Fundal height is term FH.   Estimated fetal weight is AGA.   Fetal presentation: vertex  Introitus: Normal vulva. Normal vagina.  Pelvis: adequate for delivery.   Cervix: Cervix evaluated by digital exam.     Physical Exam  Constitutional: She appears well-developed and well-nourished.  HENT:  Head: Normocephalic and atraumatic.  Neck: Normal range of motion. Neck supple.  Cardiovascular: Normal rate and  regular rhythm.   Respiratory: Breath sounds normal.  GI:  Term [id number]  Genitourinary:  3/75%/VTX per RN exam  Musculoskeletal: Normal range of motion.  Neurological: She is alert.    Prenatal labs: ABO, Rh: A/Positive/-- (08/12 0000) Antibody:   Rubella: Nonimmune (08/12 0000) RPR: Nonreactive (08/12 0000)  HBsAg: Negative (08/12 0000)  HIV: Non-reactive (08/12 0000)  GBS: Negative (03/04 0000)   Assessment/Plan: Term IUP, labor   Stacey Cole M 12/02/2013, 10:44 AM

## 2013-12-03 ENCOUNTER — Encounter (HOSPITAL_COMMUNITY): Payer: Self-pay | Admitting: *Deleted

## 2013-12-03 LAB — CBC
HCT: 32.1 % — ABNORMAL LOW (ref 36.0–46.0)
HEMOGLOBIN: 10.9 g/dL — AB (ref 12.0–15.0)
MCH: 28 pg (ref 26.0–34.0)
MCHC: 34 g/dL (ref 30.0–36.0)
MCV: 82.5 fL (ref 78.0–100.0)
Platelets: 211 10*3/uL (ref 150–400)
RBC: 3.89 MIL/uL (ref 3.87–5.11)
RDW: 14.9 % (ref 11.5–15.5)
WBC: 16 10*3/uL — AB (ref 4.0–10.5)

## 2013-12-03 MED ORDER — MEASLES, MUMPS & RUBELLA VAC ~~LOC~~ INJ
0.5000 mL | INJECTION | Freq: Once | SUBCUTANEOUS | Status: AC
  Administered 2013-12-04: 0.5 mL via SUBCUTANEOUS
  Filled 2013-12-03: qty 0.5

## 2013-12-03 NOTE — Progress Notes (Signed)
Post Partum Day 1 Subjective: no complaints  Objective: Blood pressure 103/64, pulse 62, temperature 97.6 F (36.4 C), temperature source Oral, resp. rate 20, height 5\' 5"  (1.651 m), weight 230 lb (104.327 kg), SpO2 99.00%, unknown if currently breastfeeding.  Physical Exam:  General: alert Lochia: appropriate Uterine Fundus: firm Incision: healing well DVT Evaluation: No evidence of DVT seen on physical exam.   Recent Labs  12/02/13 0918 12/03/13 0603  HGB 12.7 10.9*  HCT 36.7 32.1*    Assessment/Plan: Plan for discharge tomorrow   LOS: 1 day   Stacey Cole M 12/03/2013, 7:36 AM

## 2013-12-03 NOTE — Progress Notes (Signed)
Patient given VIS sheet for MMR vaccine. Patient consented to vaccination.

## 2013-12-03 NOTE — Anesthesia Postprocedure Evaluation (Signed)
  Anesthesia Post-op Note  Patient: Stacey Cole  Procedure(s) Performed: * No procedures listed *  Patient Location: PACU and Mother/Baby  Anesthesia Type:Epidural  Level of Consciousness: awake, alert  and oriented  Airway and Oxygen Therapy: Patient Spontanous Breathing  Post-op Pain: none  Post-op Assessment: Post-op Vital signs reviewed, Patient's Cardiovascular Status Stable, No headache, No backache, No residual numbness and No residual motor weakness  Post-op Vital Signs: Reviewed and stable  Complications: No apparent anesthesia complications

## 2013-12-04 MED ORDER — OXYCODONE-ACETAMINOPHEN 5-325 MG PO TABS: 1.0000 | ORAL_TABLET | Freq: Four times a day (QID) | ORAL | Status: DC | PRN

## 2013-12-04 MED ORDER — IBUPROFEN 800 MG PO TABS: 800.0000 mg | ORAL_TABLET | Freq: Three times a day (TID) | ORAL | Status: DC | PRN

## 2013-12-04 NOTE — Discharge Summary (Signed)
Obstetric Discharge Summary Reason for Admission: onset of labor Prenatal Procedures: none Intrapartum Procedures: spontaneous vaginal delivery Postpartum Procedures: none Complications-Operative and Postpartum: none Hemoglobin  Date Value Ref Range Status  12/03/2013 10.9* 12.0 - 15.0 g/dL Final  1/61/09608/08/2013 45.413.8   Final     HCT  Date Value Ref Range Status  12/03/2013 32.1* 36.0 - 46.0 % Final  04/19/2013 40   Final    Physical Exam:  General: alert Lochia: appropriate Uterine Fundus: firm Incision: healing well DVT Evaluation: No evidence of DVT seen on physical exam.  Discharge Diagnoses: Term Pregnancy-delivered  Discharge Information: Date: 12/04/2013 Activity: pelvic rest Diet: routine Medications: PNV, Ibuprofen and Percocet Condition: stable Instructions: refer to practice specific booklet Discharge to: home Follow-up Information   [facility] Schedule an appointment as soon as possible for a visit in 6 weeks.   Contact information:   [address] [place] [id number] [phone]      Newborn Data: Live born female  Birth Weight: 7 lb 12.2 oz (3521 g) APGAR: 9, 10  Home with mother.  Stacey Cole,Stacey Cole M 12/04/2013, 8:32 AM

## 2013-12-06 ENCOUNTER — Ambulatory Visit (HOSPITAL_COMMUNITY)
Admit: 2013-12-06 | Discharge: 2013-12-06 | Disposition: A | Discharge status: Home / Self Care | Payer: BC Managed Care – PPO | Attending: Obstetrics and Gynecology | Admitting: Obstetrics and Gynecology

## 2013-12-06 NOTE — Lactation Note (Signed)
Adult Lactation Consultation Outpatient Visit Note  Patient Name: Stacey Cole                                         "[name]" Date of Birth: 1991/07/12                                                       DOB, [birth year]27 Gestational Age at Delivery: Unknown                                   BW: 7-12 Type of Delivery:                                                                     Today's Weight:7-6.1, 3348  Breastfeeding History: Frequency of Breastfeeding:    Every 2-3 hours Length of Feeding: 10 mins on and off Voids: 4 Stools: 5 yellow mustard.   Supplementing / Method: giving 20 ml of formula Pumping:  Type of Pump:Medela pump n Style advance   Frequency:2 times for 15mins  Volume:  20-25 ml  Comments :Mother states that infant began sucking his tongue right after birth. He became very difficult to latch. She states she was fit with a nipple shield while in the hospital and infant began taking a bottle for supplement. Mother is using EBM and formula . Mother states milk is not in yet. Mother describes a painful latch the entire feeding . She also has breast pain .    Consultation Evaluation: Observed that mothers nipples are intact. Mothers breast are full and firm, she has slight reddness on lower half of the (R) breast at 5 o'clock . Observed slightly warm to touch. Reviewed S/S of Mastitis. Recommend to phone OB if symptoms worsen within the next 2 days.   Warm moist compresses applied to mothers breast with good breast massage. Mother pumped for 10 mins to soften breast to latch infant on.  Infant latched on with a shallow latch. Repeat attempts to latch without the nipple shield. Mother has a short shaft nipple. Nipple was observed a being pinched when infant released the breast.  Mother was fit with a #24 nipple shield. Infant was unable to get depth. A #20 nipple shield was used . Observed milk transfer in the tip of the nipple shield. Observed that infant cups his  tongue and has limited extension of his tongue. Infant may have a short posterior frenula.   Initial Feeding Assessment: Pre-feed RUEAVW:0981Weight:3348 Post-feed XBJYNW:2956Weight:3356 Amount Transferred:8 ml Comments:   Total Breast milk Transferred this Visit: 8 ml Total Supplement Given: 10 ml with a curved tip syringe and a gloved finger.   Additional Interventions:  Recommend that mother treat engorgement with good massage for 5 mins. Ice packs to breast every 3-4 hours for 15 -20 mins.  Advised mother to pre pump breast for 2-3 mins to soften areola.  Offer breast with or without  the #20 nipple shield Mother to assure that infant has a wide open gape and sustains latch with good burst of suckling while seeing milk in the nipple shield. Mother to continue to offer infant supplement of at least 30 ml after each feeding Recommend that mother post pump 4-6 times daily until milk in. Recommend follow up with Peds to have evaluation of tongue mobility.    Follow-Up   April 7 at 9:00    Stevan Born [facility] 12/06/2013, 2:48 PM

## 2013-12-07 ENCOUNTER — Inpatient Hospital Stay (HOSPITAL_COMMUNITY): Admission: RE | Admit: 2013-12-07 | Payer: BC Managed Care – PPO | Source: Ambulatory Visit

## 2013-12-13 ENCOUNTER — Ambulatory Visit (HOSPITAL_COMMUNITY)
Admission: RE | Admit: 2013-12-13 | Discharge: 2013-12-13 | Disposition: A | Discharge status: Home / Self Care | Payer: BC Managed Care – PPO | Source: Ambulatory Visit | Attending: Obstetrics and Gynecology | Admitting: Obstetrics and Gynecology

## 2013-12-13 NOTE — Lactation Note (Addendum)
Adult Lactation Consultation Outpatient Visit Note  Patient Name: Stacey Cole                                            "[name]"                                                                                                                                    Weight today: 7-14.6, 3586    Date of Birth: 1991-08-20                                                         Gain of 8 ounces in [redacted] week Gestational Age at Delivery: Unknown                                     74 days old Type of Delivery: vaginal del  Breastfeeding History: Frequency of Breastfeeding: once a day 5-10 mins Length of Feeding:  Voids: 8 Stools: 4 yellow seedy  Supplementing / Method: Pumping:  Type of Pump:Pump N Style Advanced   Frequency: 4 times daily for 20 mins  Volume:  2-3 ounce  Comments: Mother states that infant refuses the breast most of the time. She states that she offers breast every 2-3 hours. The longest feeding was 10 mins.Mother bottle feeds infant 2-3 ounces of EBM/formula every 2-3 hours.    Consultation Evaluation: SNS starter was sat up using 40 ml of formula. Infant took 28 ml. Feeding went well on (R) breast. Attempt to offer alternate breast using a #24 nipple shield. Infant took another 10 ml from SNS. Mother has been using a #20 nipple shield that is too small. Infant gets entire nipple shaft and areola in his mouth. He had a wide gape with #24 nipple shield.   Mothers nipples were flat with areola edema last week. Her nipples are erect and breast tissue is very compressible.  Infant is able to get much deeper latch. 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 I do recommend that you evaluate infants tongue for mobility.  Mother is very determined to breastfeed. She is willing to post pump 6-8 times a day to increase her milk supply.   Initial Feeding Assessment: Pre-feed XBJYNW:2956 Post-feed OZHYQM:5784 Amount Transferred:63ml Comments:  Additional  Feeding Assessment: Pre-feed Weight:3590 Post-feed Weight:3600 Amount Transferred:86ml Comments:   Total Breast milk Transferred this Visit: 38ml from SNS 10 ml  transferd from  mother  Total Supplement Given: 10 ml with a bottle   Additional Interventions:  Advised mother to  continue to cue base feed infant Recommend that she use SNS with or without the nipple shield. Instruct mother to give at least 2 ounces with each feeding every 2-3 hours In infant is unable to transfer entire amt given the rest of feeding with a bottle. Informed mother that infant may begin to take up to 3 ounces Recommend that mother post pump every 2-3 hours for 20 mins.  Pump at least 6-8 times daily Suggested power pumping once a day or every other day Mother to take supplement to increase milk supplly Mother to reschedule Peds appt as soon as possible to have tongue evaluated Follow up in 1 week for feeding assessment   Follow-Up  April 13 at 10:30    Stevan BornKendrick, Domnick Chervenak [facility] 12/13/2013, 9:09 AM

## 2013-12-19 ENCOUNTER — Ambulatory Visit (HOSPITAL_COMMUNITY)
Admission: RE | Admit: 2013-12-19 | Discharge: 2013-12-19 | Disposition: A | Discharge status: Home / Self Care | Payer: BC Managed Care – PPO | Source: Ambulatory Visit | Attending: Obstetrics and Gynecology | Admitting: Obstetrics and Gynecology

## 2013-12-19 NOTE — Lactation Note (Signed)
Adult Lactation Consultation Outpatient Visit Note;   Patient Name: Stacey Cole                                                 "Stacey Cole"                        Date of Birth: 1991-04-11                                                               [phone] Gestational Age at Delivery: Unknown                                           Gain of  8 Ounces in 6 days Type of Delivery:   Breastfeeding History: Frequency of Breastfeeding: every 2-3 hours  Length of Feeding: 15 mins Voids: QS Stools: QS  Supplementing / Method: Bottle fed 2-2 1/2 ounces after each breastfeeding Pumping:  Type of Pump:Medela    Frequency:6-8 times  Volume:  2-3 ounces  Comments:Mother states that Peds plans to call her when they have a scheduled appt with [facility].  Mother states she stopped using the single SNS. She states that it was too frustrating .    Consultation Evaluation: Mother states that she really wants to be successful with breastfeeding. She is willing to continue to pursue exclusively breast feeding.  I do have concerns   Initial Feeding Assessment: observed mother self latch infant. Infant continues to chew and chomp at the breast. Observed feeding for 15-20 mins. Good breast compression while feeding.  Pre-feed [id number]:1191Weight:3814 Post-feed Weight:3820 Amount Transferred:6 mls Comments:  Additional Feeding Assessment:Double SNS was sat up with 2 .5 ounces, observed more consistent suckling and swallowing.  Pre-feed Weight:3790 Post-feed YNWGNF:6213Weight:3824 Amount Transferred:34 ml Comments:   Total Breast milk Transferred this Visit: 6 ml  Total Supplement Given: 34 ml  From SNS then GMOB gave infant another 2 ounces with a bottle  Additional Interventions:  Mother to reschedule appt to be seen after ENT Evaluates tongue Continue to offer breast with the SNS  Give infant at least 2 - 2 1/2 ounces with each feeding Give remainder if infant doesn't transfer entire amt Continue  to pump every 2-3 hours for 20 mins.  Mother has ordered Moringa for increasing milk supply Follow up in one week , [facility] to be after tongue eval if possible    Follow-Up  April 24 at 10:30    Stacey Cole Stacey Cole 12/19/2013, 5:48 PM

## 2013-12-30 ENCOUNTER — Ambulatory Visit (HOSPITAL_COMMUNITY): Payer: BC Managed Care – PPO

## 2014-07-10 ENCOUNTER — Encounter (HOSPITAL_COMMUNITY): Payer: Self-pay | Admitting: *Deleted

## 2015-05-01 ENCOUNTER — Other Ambulatory Visit: Payer: Self-pay

## 2015-05-02 LAB — CYTOLOGY - PAP

## 2017-11-17 LAB — OB RESULTS CONSOLE RPR: RPR: NONREACTIVE

## 2017-11-17 LAB — OB RESULTS CONSOLE ABO/RH: RH Type: POSITIVE

## 2017-11-17 LAB — OB RESULTS CONSOLE GC/CHLAMYDIA
Chlamydia: NEGATIVE
GC PROBE AMP, GENITAL: NEGATIVE

## 2017-11-17 LAB — OB RESULTS CONSOLE RUBELLA ANTIBODY, IGM: Rubella: IMMUNE

## 2017-11-17 LAB — OB RESULTS CONSOLE ANTIBODY SCREEN: Antibody Screen: NEGATIVE

## 2017-11-17 LAB — OB RESULTS CONSOLE HIV ANTIBODY (ROUTINE TESTING): HIV: NONREACTIVE

## 2017-11-17 LAB — OB RESULTS CONSOLE HEPATITIS B SURFACE ANTIGEN: Hepatitis B Surface Ag: NEGATIVE

## 2018-05-13 ENCOUNTER — Inpatient Hospital Stay (HOSPITAL_COMMUNITY): Admission: AD | Admit: 2018-05-13 | Payer: 59 | Source: Ambulatory Visit | Admitting: Obstetrics and Gynecology

## 2018-06-17 ENCOUNTER — Telehealth (HOSPITAL_COMMUNITY): Payer: Self-pay | Admitting: *Deleted

## 2018-06-17 ENCOUNTER — Encounter (HOSPITAL_COMMUNITY): Payer: Self-pay | Admitting: *Deleted

## 2018-06-17 NOTE — Telephone Encounter (Signed)
Preadmission screen  

## 2018-06-25 NOTE — H&P (Signed)
Stacey Cole is a 27 y.o. female presenting for IOL at term. Pregnancy complicated by H/O gastric sleeve, anxiety disorder. OB History    Gravida  3   Para  1   Term  1   Preterm      AB  1   Living  1     SAB      TAB  1   Ectopic      Multiple      Live Births  1          Past Medical History:  Diagnosis Date  . Anxiety   . Chlamydia   . GERD (gastroesophageal reflux disease)    Past Surgical History:  Procedure Laterality Date  . CHOLECYSTECTOMY    . LAPAROSCOPIC GASTRIC BAND REMOVAL WITH LAPAROSCOPIC GASTRIC SLEEVE RESECTION     Family History: family history includes Diabetes in her father, paternal aunt, and paternal uncle; Heart disease in her maternal aunt and maternal grandfather; Hypertension in her mother. Social History:  reports that she has never smoked. She has never used smokeless tobacco. She reports that she does not drink alcohol or use drugs.     Maternal Diabetes: No Genetic Screening: Normal Maternal Ultrasounds/Referrals: Normal Fetal Ultrasounds or other Referrals:  None Maternal Substance Abuse:  No Significant Maternal Medications:  None Significant Maternal Lab Results:  None Other Comments:  None  Review of Systems  Eyes: Negative for blurred vision.  Gastrointestinal: Negative for abdominal pain.  Neurological: Negative for headaches.   History   Last menstrual period 09/22/2017, unknown if currently breastfeeding. Exam Physical Exam  Cardiovascular: Normal rate.  Respiratory: Effort normal.  GI: Soft.    Prenatal labs: ABO, Rh: A/Positive/-- (03/12 0000) Antibody: n (03/12 0000) Rubella: Immune (03/12 0000) RPR: Nonreactive (03/12 0000)  HBsAg: Negative (03/12 0000)  HIV: Non-reactive (03/12 0000)  GBS:   negative  Assessment/Plan: 27 yo G2P1 @ 39 weeks for two stage IOL   Stacey Cole II 06/25/2018, 1:57 PM

## 2018-06-28 ENCOUNTER — Inpatient Hospital Stay (HOSPITAL_COMMUNITY): Payer: 59 | Admitting: Anesthesiology

## 2018-06-28 ENCOUNTER — Other Ambulatory Visit: Payer: Self-pay

## 2018-06-28 ENCOUNTER — Inpatient Hospital Stay (HOSPITAL_COMMUNITY)
Admission: RE | Admit: 2018-06-28 | Discharge: 2018-06-29 | DRG: 807 | Disposition: A | Discharge status: Home / Self Care | Payer: 59 | Attending: Obstetrics and Gynecology | Admitting: Obstetrics and Gynecology

## 2018-06-28 ENCOUNTER — Encounter (HOSPITAL_COMMUNITY): Payer: Self-pay

## 2018-06-28 DIAGNOSIS — F419 Anxiety disorder, unspecified: Secondary | ICD-10-CM | POA: Diagnosis present

## 2018-06-28 DIAGNOSIS — O99844 Bariatric surgery status complicating childbirth: Secondary | ICD-10-CM | POA: Diagnosis present

## 2018-06-28 DIAGNOSIS — O99344 Other mental disorders complicating childbirth: Secondary | ICD-10-CM | POA: Diagnosis present

## 2018-06-28 DIAGNOSIS — Z3A39 39 weeks gestation of pregnancy: Secondary | ICD-10-CM | POA: Diagnosis not present

## 2018-06-28 DIAGNOSIS — Z3490 Encounter for supervision of normal pregnancy, unspecified, unspecified trimester: Secondary | ICD-10-CM

## 2018-06-28 LAB — CBC
HCT: 36.6 % (ref 36.0–46.0)
Hemoglobin: 12.4 g/dL (ref 12.0–15.0)
MCH: 28.2 pg (ref 26.0–34.0)
MCHC: 33.9 g/dL (ref 30.0–36.0)
MCV: 83.2 fL (ref 80.0–100.0)
PLATELETS: 274 10*3/uL (ref 150–400)
RBC: 4.4 MIL/uL (ref 3.87–5.11)
RDW: 13.6 % (ref 11.5–15.5)
WBC: 7.4 10*3/uL (ref 4.0–10.5)

## 2018-06-28 LAB — TYPE AND SCREEN
ABO/RH(D): A POS
Antibody Screen: NEGATIVE

## 2018-06-28 LAB — RPR: RPR Ser Ql: NONREACTIVE

## 2018-06-28 MED ORDER — BENZOCAINE-MENTHOL 20-0.5 % EX AERO
1.0000 "application " | INHALATION_SPRAY | CUTANEOUS | Status: DC | PRN
  Administered 2018-06-28: 1 via TOPICAL
  Filled 2018-06-28: qty 56

## 2018-06-28 MED ORDER — SENNOSIDES-DOCUSATE SODIUM 8.6-50 MG PO TABS
2.0000 | ORAL_TABLET | ORAL | Status: DC
  Administered 2018-06-28: 2 via ORAL
  Filled 2018-06-28: qty 2

## 2018-06-28 MED ORDER — ONDANSETRON HCL 4 MG PO TABS: 4.0000 mg | ORAL_TABLET | ORAL | Status: DC | PRN

## 2018-06-28 MED ORDER — SIMETHICONE 80 MG PO CHEW: 80.0000 mg | CHEWABLE_TABLET | ORAL | Status: DC | PRN

## 2018-06-28 MED ORDER — TERBUTALINE SULFATE 1 MG/ML IJ SOLN
0.2500 mg | Freq: Once | INTRAMUSCULAR | Status: DC | PRN
  Filled 2018-06-28: qty 1

## 2018-06-28 MED ORDER — ACETAMINOPHEN 325 MG PO TABS: 650.0000 mg | ORAL_TABLET | ORAL | Status: DC | PRN

## 2018-06-28 MED ORDER — SOD CITRATE-CITRIC ACID 500-334 MG/5ML PO SOLN: 30.0000 mL | ORAL | Status: DC | PRN

## 2018-06-28 MED ORDER — DIPHENHYDRAMINE HCL 50 MG/ML IJ SOLN: 12.5000 mg | INTRAMUSCULAR | Status: DC | PRN

## 2018-06-28 MED ORDER — BUTORPHANOL TARTRATE 1 MG/ML IJ SOLN
1.0000 mg | INTRAMUSCULAR | Status: DC | PRN
  Administered 2018-06-28: 1 mg via INTRAVENOUS
  Filled 2018-06-28: qty 1

## 2018-06-28 MED ORDER — LACTATED RINGERS IV SOLN
INTRAVENOUS | Status: DC
  Administered 2018-06-28 (×4): via INTRAVENOUS

## 2018-06-28 MED ORDER — OXYTOCIN BOLUS FROM INFUSION
500.0000 mL | Freq: Once | INTRAVENOUS | Status: AC
  Administered 2018-06-28: 500 mL via INTRAVENOUS

## 2018-06-28 MED ORDER — LIDOCAINE HCL (PF) 1 % IJ SOLN
30.0000 mL | INTRAMUSCULAR | Status: DC | PRN
  Filled 2018-06-28: qty 30

## 2018-06-28 MED ORDER — FENTANYL 2.5 MCG/ML BUPIVACAINE 1/10 % EPIDURAL INFUSION (WH - ANES)
14.0000 mL/h | INTRAMUSCULAR | Status: DC | PRN
  Administered 2018-06-28: 14 mL/h via EPIDURAL
  Filled 2018-06-28: qty 100

## 2018-06-28 MED ORDER — EPHEDRINE 5 MG/ML INJ
10.0000 mg | INTRAVENOUS | Status: DC | PRN
  Filled 2018-06-28: qty 2

## 2018-06-28 MED ORDER — OXYCODONE HCL 5 MG PO TABS: 10.0000 mg | ORAL_TABLET | ORAL | Status: DC | PRN

## 2018-06-28 MED ORDER — PRENATAL MULTIVITAMIN CH
1.0000 | ORAL_TABLET | Freq: Every day | ORAL | Status: DC
  Administered 2018-06-29: 1 via ORAL
  Filled 2018-06-28: qty 1

## 2018-06-28 MED ORDER — OXYCODONE-ACETAMINOPHEN 5-325 MG PO TABS: 2.0000 | ORAL_TABLET | ORAL | Status: DC | PRN

## 2018-06-28 MED ORDER — IBUPROFEN 600 MG PO TABS
600.0000 mg | ORAL_TABLET | Freq: Four times a day (QID) | ORAL | Status: DC
  Administered 2018-06-28 – 2018-06-29 (×4): 600 mg via ORAL
  Filled 2018-06-28 (×4): qty 1

## 2018-06-28 MED ORDER — PHENYLEPHRINE 40 MCG/ML (10ML) SYRINGE FOR IV PUSH (FOR BLOOD PRESSURE SUPPORT)
80.0000 ug | PREFILLED_SYRINGE | INTRAVENOUS | Status: DC | PRN
  Administered 2018-06-28: 80 ug via INTRAVENOUS
  Filled 2018-06-28: qty 5
  Filled 2018-06-28: qty 10

## 2018-06-28 MED ORDER — LACTATED RINGERS IV SOLN: 500.0000 mL | INTRAVENOUS | Status: DC | PRN

## 2018-06-28 MED ORDER — TETANUS-DIPHTH-ACELL PERTUSSIS 5-2.5-18.5 LF-MCG/0.5 IM SUSP: 0.5000 mL | Freq: Once | INTRAMUSCULAR | Status: DC

## 2018-06-28 MED ORDER — OXYCODONE-ACETAMINOPHEN 5-325 MG PO TABS: 1.0000 | ORAL_TABLET | ORAL | Status: DC | PRN

## 2018-06-28 MED ORDER — WITCH HAZEL-GLYCERIN EX PADS: 1.0000 "application " | MEDICATED_PAD | CUTANEOUS | Status: DC | PRN

## 2018-06-28 MED ORDER — ZOLPIDEM TARTRATE 5 MG PO TABS
5.0000 mg | ORAL_TABLET | Freq: Every evening | ORAL | Status: DC | PRN
  Administered 2018-06-28: 5 mg via ORAL
  Filled 2018-06-28: qty 1

## 2018-06-28 MED ORDER — PHENYLEPHRINE 40 MCG/ML (10ML) SYRINGE FOR IV PUSH (FOR BLOOD PRESSURE SUPPORT)
80.0000 ug | PREFILLED_SYRINGE | INTRAVENOUS | Status: DC | PRN
  Filled 2018-06-28: qty 5
  Filled 2018-06-28: qty 10

## 2018-06-28 MED ORDER — LIDOCAINE HCL (PF) 1 % IJ SOLN
INTRAMUSCULAR | Status: DC | PRN
  Administered 2018-06-28: 10 mL via EPIDURAL

## 2018-06-28 MED ORDER — OXYTOCIN 40 UNITS IN LACTATED RINGERS INFUSION - SIMPLE MED: 2.5000 [IU]/h | INTRAVENOUS | Status: DC

## 2018-06-28 MED ORDER — LACTATED RINGERS IV SOLN: 500.0000 mL | Freq: Once | INTRAVENOUS | Status: DC

## 2018-06-28 MED ORDER — DIPHENHYDRAMINE HCL 25 MG PO CAPS: 25.0000 mg | ORAL_CAPSULE | Freq: Four times a day (QID) | ORAL | Status: DC | PRN

## 2018-06-28 MED ORDER — ONDANSETRON HCL 4 MG/2ML IJ SOLN
4.0000 mg | Freq: Four times a day (QID) | INTRAMUSCULAR | Status: DC | PRN
  Administered 2018-06-28: 4 mg via INTRAVENOUS
  Filled 2018-06-28: qty 2

## 2018-06-28 MED ORDER — OXYCODONE HCL 5 MG PO TABS: 5.0000 mg | ORAL_TABLET | ORAL | Status: DC | PRN

## 2018-06-28 MED ORDER — DIBUCAINE 1 % RE OINT: 1.0000 "application " | TOPICAL_OINTMENT | RECTAL | Status: DC | PRN

## 2018-06-28 MED ORDER — ONDANSETRON HCL 4 MG/2ML IJ SOLN: 4.0000 mg | INTRAMUSCULAR | Status: DC | PRN

## 2018-06-28 MED ORDER — MISOPROSTOL 25 MCG QUARTER TABLET
25.0000 ug | ORAL_TABLET | ORAL | Status: DC | PRN
  Administered 2018-06-28: 25 ug via VAGINAL
  Filled 2018-06-28 (×2): qty 1

## 2018-06-28 MED ORDER — OXYTOCIN 40 UNITS IN LACTATED RINGERS INFUSION - SIMPLE MED
1.0000 m[IU]/min | INTRAVENOUS | Status: DC
  Administered 2018-06-28: 2 m[IU]/min via INTRAVENOUS
  Filled 2018-06-28: qty 1000

## 2018-06-28 MED ORDER — ESCITALOPRAM OXALATE 20 MG PO TABS
20.0000 mg | ORAL_TABLET | Freq: Every day | ORAL | Status: DC
  Administered 2018-06-29: 20 mg via ORAL
  Filled 2018-06-28 (×3): qty 1

## 2018-06-28 MED ORDER — FLEET ENEMA 7-19 GM/118ML RE ENEM: 1.0000 | ENEMA | RECTAL | Status: DC | PRN

## 2018-06-28 MED ORDER — COCONUT OIL OIL: 1.0000 "application " | TOPICAL_OIL | Status: DC | PRN

## 2018-06-28 MED ORDER — ZOLPIDEM TARTRATE 5 MG PO TABS: 5.0000 mg | ORAL_TABLET | Freq: Every evening | ORAL | Status: DC | PRN

## 2018-06-28 NOTE — Progress Notes (Signed)
4/50/-2/well applied vtx AROM clear FHT cat one

## 2018-06-28 NOTE — Anesthesia Pain Management Evaluation Note (Signed)
  CRNA Pain Management Visit Note  Patient: Stacey Cole, 27 y.o., female  "Hello I am a member of the anesthesia team at [facility]. We have an anesthesia team available at all times to provide care throughout the hospital, including epidural management and anesthesia for C-section. I don't know your plan for the delivery whether it a natural birth, water birth, IV sedation, nitrous supplementation, doula or epidural, but we want to meet your pain goals."   1.Was your pain managed to your expectations on prior hospitalizations?   Yes   2.What is your expectation for pain management during this hospitalization?     Epidural  3.How can we help you reach that goal? Epidural  Record the patient's initial score and the patient's pain goal.   Pain: 3  Pain Goal: 7 [facility] wants you to be able to say your pain was always managed very well.  Ezana Hubbert 06/28/2018

## 2018-06-28 NOTE — Progress Notes (Signed)
No change to H&P per patient history  Vitals:   06/28/18 0622 06/28/18 0718  BP: 121/75 113/78  Pulse: 63 60  Resp: 16 16  Temp: 97.7 F (36.5 C)    Cx 2/30/vtx/-3/soft  FHT cat one UCs q2-4 min  Results for orders placed or performed during the hospital encounter of 06/28/18 (from the past 24 hour(s))  Type and screen [facility]     Status: None   Collection Time: 06/28/18 12:50 AM  Result Value Ref Range   ABO/RH(D) A POS    Antibody Screen NEG    Sample Expiration      07/01/2018 Performed at [facility], [address], [place], [place] [address]   CBC     Status: None   Collection Time: 06/28/18 12:58 AM  Result Value Ref Range   WBC 7.4 4.0 - 10.5 K/uL   RBC 4.40 3.87 - 5.11 MIL/uL   Hemoglobin 12.4 12.0 - 15.0 g/dL   HCT 13.0 86.5 - 78.4 %   MCV 83.2 80.0 - 100.0 fL   MCH 28.2 26.0 - 34.0 pg   MCHC 33.9 30.0 - 36.0 g/dL   RDW 69.6 29.5 - 28.4 %   Platelets 274 150 - 400 K/uL   A/P: D/W options         Will begin pitocin augmentation

## 2018-06-28 NOTE — Anesthesia Postprocedure Evaluation (Signed)
Anesthesia Post Note  Patient: Stacey Cole  Procedure(s) Performed: AN AD HOC LABOR EPIDURAL     Patient location during evaluation: Mother Baby Anesthesia Type: Epidural Level of consciousness: awake and alert and oriented Pain management: satisfactory to patient Vital Signs Assessment: post-procedure vital signs reviewed and stable Respiratory status: respiratory function stable Cardiovascular status: stable Postop Assessment: no headache, no backache, epidural receding, patient able to bend at knees, no signs of nausea or vomiting and adequate PO intake Anesthetic complications: no    Last Vitals:  Vitals:   06/28/18 1749 06/28/18 1810  BP: (!) 90/49 (!) 96/55  Pulse: 63 61  Resp: 16   Temp: 37 C   SpO2: 100%     Last Pain:  Vitals:   06/28/18 2000  TempSrc:   PainSc: 0-No pain   Pain Goal:                 Jearlean Demauro

## 2018-06-28 NOTE — Progress Notes (Signed)
Delivery Note At 3:00 PM a viable female was delivered via Vaginal, Spontaneous (Presentation:LOA ;  ).  APGAR: , ; weight  .   Placenta status:intact , .  Cord: 3 vessels with the following complications: .  Cord pH: pending  Anesthesia:   Episiotomy: None Lacerations:  Second degree ML lac>repaired, first degree right periurethral not bleeding and not repaired Suture Repair: 2.0 vicryl rapide Est. Blood Loss (mL):  250  Mom to postpartum.  Baby to Couplet care / Skin to Skin.  Stacey Cole 06/28/2018, 3:12 PM

## 2018-06-28 NOTE — Progress Notes (Signed)
4/80/-2 FHT cat one, good response to scalp stim IUPC placed

## 2018-06-28 NOTE — Anesthesia Preprocedure Evaluation (Addendum)
Anesthesia Evaluation  Patient identified by MRN, date of birth, ID band Patient awake    Reviewed: Allergy & Precautions, H&P , NPO status , Patient's Chart, lab work & pertinent test results  History of Anesthesia Complications Negative for: history of anesthetic complications  Airway Mallampati: II  TM Distance: >3 FB Neck ROM: full    Dental no notable dental hx.    Pulmonary neg pulmonary ROS,    Pulmonary exam normal        Cardiovascular negative cardio ROS Normal cardiovascular exam Rhythm:regular Rate:Normal     Neuro/Psych negative neurological ROS  negative psych ROS   GI/Hepatic Neg liver ROS, GERD  ,  Endo/Other  negative endocrine ROS  Renal/GU negative Renal ROS  negative genitourinary   Musculoskeletal   Abdominal   Peds  Hematology negative hematology ROS (+)   Anesthesia Other Findings   Reproductive/Obstetrics (+) Pregnancy                             Anesthesia Physical Anesthesia Plan  ASA: II  Anesthesia Plan: Epidural   Post-op Pain Management:    Induction:   PONV Risk Score and Plan:   Airway Management Planned:   Additional Equipment:   Intra-op Plan:   Post-operative Plan:   Informed Consent: I have reviewed the patients History and Physical, chart, labs and discussed the procedure including the risks, benefits and alternatives for the proposed anesthesia with the patient or authorized representative who has indicated his/her understanding and acceptance.       Plan Discussed with:   Anesthesia Plan Comments:         Anesthesia Quick Evaluation  

## 2018-06-28 NOTE — Anesthesia Procedure Notes (Signed)
Epidural Patient location during procedure: OB Start time: 06/28/2018 11:43 AM End time: 06/28/2018 11:55 AM  Staffing Anesthesiologist: Lucretia Kern, MD Performed: anesthesiologist   Preanesthetic Checklist Completed: patient identified, pre-op evaluation, timeout performed, IV checked, risks and benefits discussed and monitors and equipment checked  Epidural Patient position: sitting Prep: DuraPrep Patient monitoring: heart rate, continuous pulse ox and blood pressure Approach: midline Location: L3-L4 Injection technique: LOR saline  Needle:  Needle type: Tuohy  Needle gauge: 17 G Needle length: 9 cm Needle insertion depth: 7 cm Catheter type: closed end flexible Catheter size: 19 Gauge Catheter at skin depth: 12 cm  Assessment Events: blood not aspirated, injection not painful, no injection resistance, negative IV test and no paresthesia  Additional Notes Reason for block:procedure for pain

## 2018-06-29 LAB — CBC
HCT: 29.4 % — ABNORMAL LOW (ref 36.0–46.0)
Hemoglobin: 9.8 g/dL — ABNORMAL LOW (ref 12.0–15.0)
MCH: 28 pg (ref 26.0–34.0)
MCHC: 33.3 g/dL (ref 30.0–36.0)
MCV: 84 fL (ref 80.0–100.0)
PLATELETS: 227 10*3/uL (ref 150–400)
RBC: 3.5 MIL/uL — AB (ref 3.87–5.11)
RDW: 13.4 % (ref 11.5–15.5)
WBC: 10.8 10*3/uL — AB (ref 4.0–10.5)

## 2018-06-29 MED ORDER — IBUPROFEN 600 MG PO TABS: 600.0000 mg | ORAL_TABLET | Freq: Four times a day (QID) | ORAL | 0 refills | Status: DC

## 2018-06-29 NOTE — Progress Notes (Signed)
MOB was referred for history of depression/anxiety. * Referral screened out by Clinical Social Worker because none of the following criteria appear to apply: ~ History of anxiety/depression during this pregnancy, or of post-partum depression following prior delivery. ~ Diagnosis of anxiety and/or depression within last 3 years OR * MOB's symptoms currently being treated with medication and/or therapy. Please contact the Clinical Social Worker if needs arise, by MOB request, or if MOB scores greater than 9/yes to question 10 on Edinburgh Postpartum Depression Screen.  Sharea Guinther, LCSW Clinical Social Worker  [facility]  [phone]  

## 2018-06-29 NOTE — Progress Notes (Signed)
Parent request formula to supplement breast feeding due to mother's fatigue and sore nipples. She does not wish to pump at this time. Parents have been informed of small tummy size of newborn, taught hand expression and understand the possible consequences of formula to the health of the infant. The possible consequences shared with patient include 1) Loss of confidence in breastfeeding 2) Engorgement 3) Allergic sensitization of baby(asthma/allergies) and 4) decreased milk supply for mother.After discussion of the above the mother decided to  supplement with formula.  The tool used to give formula supplement will be bottle with slow flow nipple given by dad.   Mother counseled to avoid artificial nipples because this practice may lead to latch difficulties,inadequate milk transfer and nipple soreness.   

## 2018-06-29 NOTE — Discharge Summary (Signed)
Obstetric Discharge Summary Reason for Admission: induction of labor Prenatal Procedures: ultrasound Intrapartum Procedures: spontaneous vaginal delivery Postpartum Procedures: none Complications-Operative and Postpartum: 2nd degree perineal laceration Hemoglobin  Date Value Ref Range Status  06/29/2018 9.8 (L) 12.0 - 15.0 g/dL Final    Comment:    REPEATED TO VERIFY DELTA CHECK NOTED   04/19/2013 13.8 g/dL Final   HCT  Date Value Ref Range Status  06/29/2018 29.4 (L) 36.0 - 46.0 % Final  04/19/2013 40 % Final    Physical Exam:  General: alert and cooperative Lochia: appropriate Uterine Fundus: firm Incision: n/a DVT Evaluation: No evidence of DVT seen on physical exam.  Discharge Diagnoses: Term Pregnancy-delivered  Discharge Information: Date: 06/29/2018 Activity: pelvic rest Diet: routine Medications: PNV and Ibuprofen Condition: stable Instructions: refer to practice specific booklet Discharge to: home   Newborn Data: Live born female  Birth Weight: 7 lb 9.3 oz (3440 g) APGAR: 8, 9  Newborn Delivery   Birth date/time:  06/28/2018 15:00:00 Delivery type:  Vaginal, Spontaneous     Home with mother.  Zelphia Cairo 06/29/2018, 10:17 AM

## 2018-06-29 NOTE — Lactation Note (Signed)
This note was copied from a baby's chart. Lactation Consultation Note  Patient Name: Stacey Cole [id number] Date: 06/29/2018 Reason for consult: Initial assessment;Difficult latch;Term  Visited with P2 Mom of term baby at 68 hrs old, and at 3% weight loss.  Output 5 voids and 1 stool.  Baby has had 3-4 latches of 5-10 mins, with assistance by nurses.  Mom having difficulty latching baby without assistance.    Mom called for formula to supplement baby, and baby wouldn't take the bottle.  Cain Saupe RN feeding baby a bottle and teaching parents.  Baby sucking on his tongue, rolling it, and not opening his mouth very much.  When on the nipple, he purses his mouth.  Noted a suspected posterior short frenulum, as tongue staying down in his mouth. Talked to Mom about suck training with her finger.    Mom has a poor breastfeeding history.  She reports needing to supplement her first baby while in hospital due to weight loss, and difficulty latching.  She pumped with a [employer] DEBP regularly, had OP lactation follow-up as well.  Mom states she never got normal fullness, and was only able to express drops.  She quit after 1-2 weeks of trying.    Mom reports no breast changes with either pregnancy.   Asked what her desire is with breastfeeding, and she said she wanted to try again.    Plan- 1- Keep baby STS on chest as much as possible 2- Offer the breast (ask for help) whenever baby cues to eat. 3- supplement with formula+/EBM by slow flow bottle using paced method of feeding. 4- Pump both breasts on initiation setting for 15 mins.  Encouraged Mom to do breast massage and hand expression also.  Assisted Mom with double pumping, has only pumped once in first 24 hrs.  Encouraged >8 times per 24 hrs.  Reviewed how to disassemble pump parts and wash everything after each pumping.  Lactation brochure shared with Mom.  Mom aware of IP and OP lactation support available.   Lactation Tools  Discussed/Used WIC Program: No Pump Review: Setup, frequency, and cleaning;Milk Storage Initiated by:: RN  Date initiated:: 06/29/18   Consult Status Consult Status: Follow-up Date: 06/30/18 Follow-up type: In-patient    Stacey Cole 06/29/2018, 4:03 PM

## 2018-09-08 NOTE — L&D Delivery Note (Signed)
Delivery Note At 5:53 PM a viable female was delivered via OA Presentation APGAR: 9 9 ; weight pending .   Placenta status: spontaneously with 3 vessel cord, .  Cord:  with the following complications: none.  Cord pH: not obtained  Anesthesia:  epidural Episiotomy:  none Lacerations:  none Suture Repair: 3.0 chromic Est. Blood Loss (mL):  300  Mom to postpartum.  Baby to Couplet care / Skin to Skin.  Cyril Mourning 07/17/2019, 6:02 PM

## 2018-12-27 LAB — OB RESULTS CONSOLE HEPATITIS B SURFACE ANTIGEN: Hepatitis B Surface Ag: NEGATIVE

## 2018-12-27 LAB — OB RESULTS CONSOLE RUBELLA ANTIBODY, IGM: Rubella: NON-IMMUNE/NOT IMMUNE

## 2018-12-27 LAB — OB RESULTS CONSOLE HIV ANTIBODY (ROUTINE TESTING): HIV: NONREACTIVE

## 2018-12-27 LAB — OB RESULTS CONSOLE RPR: RPR: NONREACTIVE

## 2019-01-03 LAB — OB RESULTS CONSOLE GC/CHLAMYDIA
Chlamydia: NEGATIVE
Gonorrhea: NEGATIVE

## 2019-03-18 ENCOUNTER — Inpatient Hospital Stay (HOSPITAL_BASED_OUTPATIENT_CLINIC_OR_DEPARTMENT_OTHER): Payer: 59

## 2019-03-18 ENCOUNTER — Inpatient Hospital Stay (HOSPITAL_COMMUNITY)
Admission: AD | Admit: 2019-03-18 | Discharge: 2019-03-18 | Disposition: A | Discharge status: Home / Self Care | Payer: 59 | Attending: Obstetrics & Gynecology | Admitting: Obstetrics & Gynecology

## 2019-03-18 ENCOUNTER — Encounter (HOSPITAL_COMMUNITY): Payer: Self-pay | Admitting: *Deleted

## 2019-03-18 ENCOUNTER — Other Ambulatory Visit: Payer: Self-pay

## 2019-03-18 DIAGNOSIS — O26892 Other specified pregnancy related conditions, second trimester: Secondary | ICD-10-CM | POA: Insufficient documentation

## 2019-03-18 DIAGNOSIS — Z3A21 21 weeks gestation of pregnancy: Secondary | ICD-10-CM

## 2019-03-18 DIAGNOSIS — R109 Unspecified abdominal pain: Secondary | ICD-10-CM | POA: Diagnosis not present

## 2019-03-18 DIAGNOSIS — Z3686 Encounter for antenatal screening for cervical length: Secondary | ICD-10-CM

## 2019-03-18 LAB — URINALYSIS, ROUTINE W REFLEX MICROSCOPIC
Bilirubin Urine: NEGATIVE
Glucose, UA: NEGATIVE mg/dL
Hgb urine dipstick: NEGATIVE
Ketones, ur: NEGATIVE mg/dL
Nitrite: NEGATIVE
Protein, ur: NEGATIVE mg/dL
Specific Gravity, Urine: 1.015 (ref 1.005–1.030)
pH: 7 (ref 5.0–8.0)

## 2019-03-18 NOTE — MAU Provider Note (Addendum)
Chief Complaint: Abdominal Pain and Vaginal Pain   First Provider Initiated Contact with Patient 03/18/19 1948     SUBJECTIVE HPI: Les PouGabriela T Cole is a 28 y.o. [id number] at 1611w2d who presents to Maternity Admissions reporting pelvic pressure. Symptoms started last night & feels like it occurs every 30 minutes or so. Denies vaginal bleeding or LOF. Denies dysuria but does reports that the pressure is worse when she voids. Last BM was this morning & was normal for her. Denies recent intercourse.   Location: abdomen/pelvis Quality: pressure, sharp Severity: 6/10 on pain scale Duration: 1 day Timing: intermittent Modifying factors: none Associated signs and symptoms: none  Past Medical History:  Diagnosis Date  . Anxiety   . Chlamydia   . GERD (gastroesophageal reflux disease)    OB History  Gravida Para Term Preterm AB Living  4 2 2   1 2   SAB TAB Ectopic Multiple Live Births    1   0 2    # Outcome Date GA Lbr Len/2nd Weight Sex Delivery Anes PTL Lv  4 Current           3 Term 06/28/18 798w0d 00:09 / 00:11 3440 g M Vag-Spont EPI  LIV  2 Term 12/02/13 473w3d 16:28 / 00:25 3521 g M Vag-Spont EPI  LIV     Birth Comments: within normal limits  1 TAB            Past Surgical History:  Procedure Laterality Date  . CHOLECYSTECTOMY    . LAPAROSCOPIC GASTRIC BAND REMOVAL WITH LAPAROSCOPIC GASTRIC SLEEVE RESECTION     Social History   Socioeconomic History  . Marital status: Married    Spouse name: Not on file  . Number of children: Not on file  . Years of education: Not on file  . Highest education level: Not on file  Occupational History  . Not on file  Social Needs  . Financial resource strain: Not on file  . Food insecurity    Worry: Not on file    Inability: Not on file  . Transportation needs    Medical: Not on file    Non-medical: Not on file  Tobacco Use  . Smoking status: Never Smoker  . Smokeless tobacco: Never Used  Substance and Sexual Activity  . Alcohol  use: No  . Drug use: No  . Sexual activity: Not on file  Lifestyle  . Physical activity    Days per week: Not on file    Minutes per session: Not on file  . Stress: Not on file  Relationships  . Social Musicianconnections    Talks on phone: Not on file    Gets together: Not on file    Attends religious service: Not on file    Active member of club or organization: Not on file    Attends meetings of clubs or organizations: Not on file    Relationship status: Not on file  . Intimate partner violence    Fear of current or ex partner: Not on file    Emotionally abused: Not on file    Physically abused: Not on file    Forced sexual activity: Not on file  Other Topics Concern  . Not on file  Social History Narrative  . Not on file   Family History  Problem Relation Age of Onset  . Hypertension Mother   . Diabetes Father   . Heart disease Maternal Aunt   . Diabetes Paternal Aunt   . Diabetes  Paternal Uncle   . Heart disease Maternal Grandfather    No current facility-administered medications on file prior to encounter.    Current Outpatient Medications on File Prior to Encounter  Medication Sig Dispense Refill  . escitalopram (LEXAPRO) 10 MG tablet Take 10 mg by mouth daily.   8  . Prenatal Vit-Fe Fumarate-FA (PRENATAL MULTIVITAMIN) TABS tablet Take 1 tablet by mouth daily at 12 noon.    Marland Kitchen ibuprofen (ADVIL,MOTRIN) 600 MG tablet Take 1 tablet (600 mg total) by mouth every 6 (six) hours. 30 tablet 0   No Known Allergies  I have reviewed patient's Past Medical Hx, Surgical Hx, Family Hx, Social Hx, medications and allergies.   Review of Systems  Constitutional: Negative.   Gastrointestinal: Positive for abdominal pain. Negative for constipation, diarrhea, nausea and vomiting.  Genitourinary: Positive for vaginal pain. Negative for dysuria.    OBJECTIVE Patient Vitals for the past 24 hrs:  BP Temp Temp src Pulse Resp SpO2 Weight  03/18/19 1940 (!) 101/55 98.3 F (36.8 C) Oral 66  16 - -  03/18/19 1833 (!) 100/49 98.6 F (37 C) Oral 61 16 100 % 88.9 kg   Constitutional: Well-developed, well-nourished female in no acute distress.  Cardiovascular: normal rate & rhythm, no murmur Respiratory: normal rate and effort. Lung sounds clear throughout GI: Abd soft, non-tender, Pos BS x 4. No guarding or rebound tenderness MS: Extremities nontender, no edema, normal ROM Neurologic: Alert and oriented x 4.  GU:  Dilation: Closed(ext 1 cm) Cervical Position: Posterior Exam by:: Jorje Guild NP    LAB RESULTS Results for orders placed or performed during the hospital encounter of 03/18/19 (from the past 24 hour(s))  Urinalysis, Routine w reflex microscopic     Status: Abnormal   Collection Time: 03/18/19  6:37 PM  Result Value Ref Range   Color, Urine YELLOW YELLOW   APPearance HAZY (A) CLEAR   Specific Gravity, Urine 1.015 1.005 - 1.030   pH 7.0 5.0 - 8.0   Glucose, UA NEGATIVE NEGATIVE mg/dL   Hgb urine dipstick NEGATIVE NEGATIVE   Bilirubin Urine NEGATIVE NEGATIVE   Ketones, ur NEGATIVE NEGATIVE mg/dL   Protein, ur NEGATIVE NEGATIVE mg/dL   Nitrite NEGATIVE NEGATIVE   Leukocytes,Ua TRACE (A) NEGATIVE   RBC / HPF 0-5 0 - 5 RBC/hpf   WBC, UA 0-5 0 - 5 WBC/hpf   Bacteria, UA RARE (A) NONE SEEN   Squamous Epithelial / LPF 0-5 0 - 5   Mucus PRESENT     IMAGING  MAU COURSE Orders Placed This Encounter  Procedures  . Culture, OB Urine  . [place] MFM OB TRANSVAGINAL  . Urinalysis, Routine w reflex microscopic   No orders of the defined types were placed in this encounter.   MDM FHT present via doppler  Cervix internally closed. Externally 1 cm. Difficult to ascertain thickness. Will get TVUS for cervical length.  U/a with some leuks. Will send for urine culture.   Care turned over to [place], NP 03/18/2019  8:05 PM  Cervical length 4.3cm.  Reassured patient of normalcy.  1. [redacted] weeks gestation of pregnancy   2. Abdominal pain  during pregnancy in second trimester    -Discharge home in stable condition -Preterm labor precautions discussed -Patient advised to follow-up with OB as scheduled for prenatal care -Patient may return to MAU as needed or if her condition were to change or worsen  Wende Mott, CNM 03/18/19 8:30 PM

## 2019-03-18 NOTE — MAU Note (Signed)
Having a lot of pressure in vagina, sharp pains in lower abd.  Was told she had a " bleed in her placenta",is not bleeding but was concerned. (when asked previa, abruption, subchorionic hemorrhage- none of these were familiar). Hx of +covid on 6/18, pt was symptomatic, was never hospitalized.  Is not having any symptoms currently. Was only sick a wk.

## 2019-03-18 NOTE — Discharge Instructions (Signed)

## 2019-03-20 LAB — CULTURE, OB URINE: Culture: >=100000 — AB

## 2019-06-22 LAB — OB RESULTS CONSOLE GBS: GBS: NEGATIVE

## 2019-07-17 ENCOUNTER — Encounter (HOSPITAL_COMMUNITY): Payer: Self-pay | Admitting: *Deleted

## 2019-07-17 ENCOUNTER — Inpatient Hospital Stay (HOSPITAL_COMMUNITY)
Admission: AD | Admit: 2019-07-17 | Discharge: 2019-07-19 | DRG: 805 | Disposition: A | Discharge status: Home / Self Care | Payer: 59 | Attending: Obstetrics and Gynecology | Admitting: Obstetrics and Gynecology

## 2019-07-17 ENCOUNTER — Other Ambulatory Visit: Payer: Self-pay

## 2019-07-17 ENCOUNTER — Inpatient Hospital Stay (HOSPITAL_COMMUNITY): Payer: 59 | Admitting: Anesthesiology

## 2019-07-17 DIAGNOSIS — Z3A38 38 weeks gestation of pregnancy: Secondary | ICD-10-CM

## 2019-07-17 DIAGNOSIS — O9852 Other viral diseases complicating childbirth: Secondary | ICD-10-CM | POA: Diagnosis present

## 2019-07-17 DIAGNOSIS — O26893 Other specified pregnancy related conditions, third trimester: Secondary | ICD-10-CM | POA: Diagnosis present

## 2019-07-17 DIAGNOSIS — U071 COVID-19: Secondary | ICD-10-CM | POA: Diagnosis present

## 2019-07-17 LAB — CBC
HCT: 33.4 % — ABNORMAL LOW (ref 36.0–46.0)
Hemoglobin: 9.9 g/dL — ABNORMAL LOW (ref 12.0–15.0)
MCH: 22.1 pg — ABNORMAL LOW (ref 26.0–34.0)
MCHC: 29.6 g/dL — ABNORMAL LOW (ref 30.0–36.0)
MCV: 74.6 fL — ABNORMAL LOW (ref 80.0–100.0)
Platelets: 340 10*3/uL (ref 150–400)
RBC: 4.48 MIL/uL (ref 3.87–5.11)
RDW: 15.3 % (ref 11.5–15.5)
WBC: 10.4 10*3/uL (ref 4.0–10.5)
nRBC: 0 % (ref 0.0–0.2)

## 2019-07-17 LAB — TYPE AND SCREEN
ABO/RH(D): A POS
Antibody Screen: NEGATIVE

## 2019-07-17 LAB — SARS CORONAVIRUS 2 BY RT PCR (HOSPITAL ORDER, PERFORMED IN ~~LOC~~ HOSPITAL LAB): SARS Coronavirus 2: POSITIVE — AB

## 2019-07-17 MED ORDER — EPHEDRINE 5 MG/ML INJ: 10.0000 mg | INTRAVENOUS | Status: DC | PRN

## 2019-07-17 MED ORDER — DIPHENHYDRAMINE HCL 50 MG/ML IJ SOLN: 12.5000 mg | INTRAMUSCULAR | Status: DC | PRN

## 2019-07-17 MED ORDER — ESCITALOPRAM OXALATE 10 MG PO TABS
10.0000 mg | ORAL_TABLET | Freq: Every day | ORAL | Status: DC
  Administered 2019-07-17 – 2019-07-19 (×3): 10 mg via ORAL
  Filled 2019-07-17 (×3): qty 1

## 2019-07-17 MED ORDER — ONDANSETRON HCL 4 MG/2ML IJ SOLN: 4.0000 mg | INTRAMUSCULAR | Status: DC | PRN

## 2019-07-17 MED ORDER — ZOLPIDEM TARTRATE 5 MG PO TABS: 5.0000 mg | ORAL_TABLET | Freq: Every evening | ORAL | Status: DC | PRN

## 2019-07-17 MED ORDER — OXYTOCIN 40 UNITS IN NORMAL SALINE INFUSION - SIMPLE MED
2.5000 [IU]/h | INTRAVENOUS | Status: DC
  Administered 2019-07-17: 2.5 [IU]/h via INTRAVENOUS
  Filled 2019-07-17: qty 1000

## 2019-07-17 MED ORDER — TETANUS-DIPHTH-ACELL PERTUSSIS 5-2.5-18.5 LF-MCG/0.5 IM SUSP: 0.5000 mL | Freq: Once | INTRAMUSCULAR | Status: DC

## 2019-07-17 MED ORDER — ONDANSETRON HCL 4 MG/2ML IJ SOLN: 4.0000 mg | Freq: Four times a day (QID) | INTRAMUSCULAR | Status: DC | PRN

## 2019-07-17 MED ORDER — MEASLES, MUMPS & RUBELLA VAC IJ SOLR
0.5000 mL | Freq: Once | INTRAMUSCULAR | Status: AC
  Administered 2019-07-19: 0.5 mL via SUBCUTANEOUS
  Filled 2019-07-17: qty 0.5

## 2019-07-17 MED ORDER — BENZOCAINE-MENTHOL 20-0.5 % EX AERO: 1.0000 "application " | INHALATION_SPRAY | CUTANEOUS | Status: DC | PRN

## 2019-07-17 MED ORDER — LIDOCAINE HCL (PF) 1 % IJ SOLN: 30.0000 mL | INTRAMUSCULAR | Status: DC | PRN

## 2019-07-17 MED ORDER — ACETAMINOPHEN 325 MG PO TABS
650.0000 mg | ORAL_TABLET | ORAL | Status: DC | PRN
  Administered 2019-07-18 (×2): 650 mg via ORAL
  Filled 2019-07-17 (×2): qty 2

## 2019-07-17 MED ORDER — OXYCODONE-ACETAMINOPHEN 5-325 MG PO TABS: 1.0000 | ORAL_TABLET | ORAL | Status: DC | PRN

## 2019-07-17 MED ORDER — IBUPROFEN 600 MG PO TABS
600.0000 mg | ORAL_TABLET | Freq: Four times a day (QID) | ORAL | Status: DC
  Administered 2019-07-18 – 2019-07-19 (×7): 600 mg via ORAL
  Filled 2019-07-17 (×7): qty 1

## 2019-07-17 MED ORDER — MEDROXYPROGESTERONE ACETATE 150 MG/ML IM SUSP: 150.0000 mg | INTRAMUSCULAR | Status: DC | PRN

## 2019-07-17 MED ORDER — ONDANSETRON HCL 4 MG PO TABS: 4.0000 mg | ORAL_TABLET | ORAL | Status: DC | PRN

## 2019-07-17 MED ORDER — LACTATED RINGERS IV SOLN: 500.0000 mL | INTRAVENOUS | Status: DC | PRN

## 2019-07-17 MED ORDER — SENNOSIDES-DOCUSATE SODIUM 8.6-50 MG PO TABS
2.0000 | ORAL_TABLET | ORAL | Status: DC
  Administered 2019-07-18: 2 via ORAL
  Filled 2019-07-17 (×2): qty 2

## 2019-07-17 MED ORDER — OXYCODONE-ACETAMINOPHEN 5-325 MG PO TABS: 2.0000 | ORAL_TABLET | ORAL | Status: DC | PRN

## 2019-07-17 MED ORDER — OXYTOCIN BOLUS FROM INFUSION
500.0000 mL | Freq: Once | INTRAVENOUS | Status: AC
  Administered 2019-07-17: 500 mL/h via INTRAVENOUS

## 2019-07-17 MED ORDER — FLEET ENEMA 7-19 GM/118ML RE ENEM: 1.0000 | ENEMA | Freq: Every day | RECTAL | Status: DC | PRN

## 2019-07-17 MED ORDER — SODIUM CHLORIDE (PF) 0.9 % IJ SOLN
INTRAMUSCULAR | Status: DC | PRN
  Administered 2019-07-17: 12 mL/h via EPIDURAL

## 2019-07-17 MED ORDER — OXYCODONE HCL 5 MG PO TABS: 5.0000 mg | ORAL_TABLET | ORAL | Status: DC | PRN

## 2019-07-17 MED ORDER — COCONUT OIL OIL: 1.0000 "application " | TOPICAL_OIL | Status: DC | PRN

## 2019-07-17 MED ORDER — OXYCODONE HCL 5 MG PO TABS: 10.0000 mg | ORAL_TABLET | ORAL | Status: DC | PRN

## 2019-07-17 MED ORDER — DIPHENHYDRAMINE HCL 25 MG PO CAPS: 25.0000 mg | ORAL_CAPSULE | Freq: Four times a day (QID) | ORAL | Status: DC | PRN

## 2019-07-17 MED ORDER — ACETAMINOPHEN 325 MG PO TABS: 650.0000 mg | ORAL_TABLET | ORAL | Status: DC | PRN

## 2019-07-17 MED ORDER — DIBUCAINE (PERIANAL) 1 % EX OINT: 1.0000 "application " | TOPICAL_OINTMENT | CUTANEOUS | Status: DC | PRN

## 2019-07-17 MED ORDER — LIDOCAINE HCL (PF) 1 % IJ SOLN
INTRAMUSCULAR | Status: DC | PRN
  Administered 2019-07-17: 10 mL via EPIDURAL

## 2019-07-17 MED ORDER — LACTATED RINGERS IV SOLN
INTRAVENOUS | Status: DC
  Administered 2019-07-17: 15:00:00 via INTRAVENOUS

## 2019-07-17 MED ORDER — SOD CITRATE-CITRIC ACID 500-334 MG/5ML PO SOLN: 30.0000 mL | ORAL | Status: DC | PRN

## 2019-07-17 MED ORDER — LACTATED RINGERS IV SOLN: 500.0000 mL | Freq: Once | INTRAVENOUS | Status: DC

## 2019-07-17 MED ORDER — FLEET ENEMA 7-19 GM/118ML RE ENEM: 1.0000 | ENEMA | RECTAL | Status: DC | PRN

## 2019-07-17 MED ORDER — FENTANYL-BUPIVACAINE-NACL 0.5-0.125-0.9 MG/250ML-% EP SOLN
12.0000 mL/h | EPIDURAL | Status: DC | PRN
  Filled 2019-07-17: qty 250

## 2019-07-17 MED ORDER — PHENYLEPHRINE 40 MCG/ML (10ML) SYRINGE FOR IV PUSH (FOR BLOOD PRESSURE SUPPORT)
80.0000 ug | PREFILLED_SYRINGE | INTRAVENOUS | Status: DC | PRN
  Filled 2019-07-17: qty 10

## 2019-07-17 MED ORDER — PRENATAL MULTIVITAMIN CH
1.0000 | ORAL_TABLET | Freq: Every day | ORAL | Status: DC
  Administered 2019-07-18 – 2019-07-19 (×2): 1 via ORAL
  Filled 2019-07-17 (×2): qty 1

## 2019-07-17 MED ORDER — SIMETHICONE 80 MG PO CHEW: 80.0000 mg | CHEWABLE_TABLET | ORAL | Status: DC | PRN

## 2019-07-17 MED ORDER — BISACODYL 10 MG RE SUPP: 10.0000 mg | Freq: Every day | RECTAL | Status: DC | PRN

## 2019-07-17 MED ORDER — PHENYLEPHRINE 40 MCG/ML (10ML) SYRINGE FOR IV PUSH (FOR BLOOD PRESSURE SUPPORT): 80.0000 ug | PREFILLED_SYRINGE | INTRAVENOUS | Status: DC | PRN

## 2019-07-17 MED ORDER — WITCH HAZEL-GLYCERIN EX PADS: 1.0000 "application " | MEDICATED_PAD | CUTANEOUS | Status: DC | PRN

## 2019-07-17 NOTE — Anesthesia Procedure Notes (Signed)
Epidural Patient location during procedure: OB Start time: 07/17/2019 4:25 PM End time: 07/17/2019 4:34 PM  Staffing Anesthesiologist: Lidia Collum, MD Performed: anesthesiologist   Preanesthetic Checklist Completed: patient identified, pre-op evaluation, timeout performed, IV checked, risks and benefits discussed and monitors and equipment checked  Epidural Patient position: sitting Prep: DuraPrep Patient monitoring: heart rate, continuous pulse ox and blood pressure Approach: midline Location: L3-L4 Injection technique: LOR air  Needle:  Needle type: Tuohy  Needle gauge: 17 G Needle length: 9 cm Needle insertion depth: 7 cm Catheter type: closed end flexible Catheter size: 19 Gauge Catheter at skin depth: 12 cm Test dose: negative  Assessment Events: blood not aspirated, injection not painful, no injection resistance, negative IV test and no paresthesia  Additional Notes Reason for block:procedure for pain

## 2019-07-17 NOTE — MAU Note (Signed)
Stacey Cole is a 28 y.o. at [redacted]w[redacted]d here in MAU reporting: contractions since 0800 and they are now every 4-5 minutes. No bleeding or LOF. +FM. Was 3 cm last week at her appointment.   Onset of complaint: today  Pain score: 8/10  Vitals:   07/17/19 1444  BP: (!) 110/50  Pulse: 83  Resp: 17  Temp: 98.1 F (36.7 C)  SpO2: 100%     FHT: +FM  Lab orders placed from triage: none

## 2019-07-17 NOTE — Anesthesia Postprocedure Evaluation (Signed)
Anesthesia Post Note  Patient: LARONDA LISBY  Procedure(s) Performed: AN AD HOC LABOR EPIDURAL     Patient location during evaluation: Mother Baby Anesthesia Type: Epidural Level of consciousness: awake and alert Pain management: pain level controlled Vital Signs Assessment: post-procedure vital signs reviewed and stable Respiratory status: spontaneous breathing, nonlabored ventilation and respiratory function stable Cardiovascular status: stable Postop Assessment: no headache, no backache and epidural receding Anesthetic complications: no Comments: Per telephone conversation    Last Vitals:  Vitals:   07/17/19 1915 07/17/19 1930  BP: (!) 106/49 (!) 97/53  Pulse: 71 77  Resp: 18 16  Temp:    SpO2:      Last Pain:  Vitals:   07/17/19 1930  TempSrc:   PainSc: 1    Pain Goal:                Epidural/Spinal Function Cutaneous sensation: Able to Wiggle Toes (07/17/19 1930), Patient able to flex knees: Yes (07/17/19 1930), Patient able to lift hips off bed: Yes (07/17/19 1930), Back pain beyond tenderness at insertion site: No (07/17/19 1930), Progressively worsening motor and/or sensory loss: No (07/17/19 1930), Bowel and/or bladder incontinence post epidural: No (07/17/19 1930)  Nonah Mattes N

## 2019-07-17 NOTE — Progress Notes (Signed)
As I was delivering baby, COVID +results obtained Patient had COVID in June  Spoke to ID - given 5 months they are going to assume this is a reinfection Will institute infection prevention strategies per hospital protocol

## 2019-07-17 NOTE — H&P (Signed)
DONITA NEWLAND is a 28 y.o.G 1 P 0 at 14 w 4 days presents in active labor Now status post epidural OB History    Gravida  4   Para  2   Term  2   Preterm      AB  1   Living  2     SAB      TAB  1   Ectopic      Multiple  0   Live Births  2          Past Medical History:  Diagnosis Date  . Anxiety   . Chlamydia   . GERD (gastroesophageal reflux disease)    Past Surgical History:  Procedure Laterality Date  . CHOLECYSTECTOMY    . LAPAROSCOPIC GASTRIC BAND REMOVAL WITH LAPAROSCOPIC GASTRIC SLEEVE RESECTION     Family History: family history includes Diabetes in her father, paternal aunt, and paternal uncle; Heart disease in her maternal aunt and maternal grandfather; Hypertension in her mother. Social History:  reports that she has never smoked. She has never used smokeless tobacco. She reports that she does not drink alcohol or use drugs.     Maternal Diabetes: No Genetic Screening: Normal Maternal Ultrasounds/Referrals: Normal Fetal Ultrasounds or other Referrals:  None Maternal Substance Abuse:  No Significant Maternal Medications:  None Significant Maternal Lab Results:  None Other Comments:  None  Review of Systems  All other systems reviewed and are negative.  Maternal Medical History:  Reason for admission: Contractions.     Dilation: 8 Effacement (%): 100 Station: -1 Exam by:: DR Verle Wheeling Blood pressure (!) 93/45, pulse (!) 58, temperature 98.5 F (36.9 C), temperature source Oral, resp. rate 18, height 5\' 5"  (1.651 m), weight 91.6 kg, last menstrual period 09/29/2018, SpO2 100 %, unknown if currently breastfeeding. Maternal Exam:  Uterine Assessment: Contraction strength is moderate.  Contraction frequency is regular.   Abdomen: Fetal presentation: vertex     Physical Exam  Nursing note and vitals reviewed. Constitutional: She appears well-developed and well-nourished.  HENT:  Head: Normocephalic and atraumatic.  Eyes: Pupils  are equal, round, and reactive to light.  Cardiovascular: Normal rate and regular rhythm.  Respiratory: Effort normal.    Prenatal labs: ABO, Rh: --/--/A POS (11/08 1514) Antibody: NEG (11/08 1514) Rubella: Nonimmune (04/20 0000) RPR: Nonreactive (04/20 0000)  HBsAg: Negative (04/20 0000)  HIV: Non-reactive (04/20 0000)  GBS: Negative/-- (10/14 0000)   Assessment/Plan: IUP at term [place] 07/17/2019, 5:03 PM

## 2019-07-17 NOTE — Anesthesia Preprocedure Evaluation (Signed)
Anesthesia Evaluation  Patient identified by MRN, date of birth, ID band Patient awake    Reviewed: Allergy & Precautions, H&P , NPO status , Patient's Chart, lab work & pertinent test results  History of Anesthesia Complications Negative for: history of anesthetic complications  Airway Mallampati: II  TM Distance: >3 FB Neck ROM: full    Dental no notable dental hx.    Pulmonary neg pulmonary ROS,    Pulmonary exam normal        Cardiovascular negative cardio ROS Normal cardiovascular exam Rhythm:regular Rate:Normal     Neuro/Psych PSYCHIATRIC DISORDERS Anxiety negative neurological ROS     GI/Hepatic Neg liver ROS, GERD  ,  Endo/Other  negative endocrine ROS  Renal/GU negative Renal ROS  negative genitourinary   Musculoskeletal   Abdominal   Peds  Hematology negative hematology ROS (+)   Anesthesia Other Findings   Reproductive/Obstetrics (+) Pregnancy                             Anesthesia Physical Anesthesia Plan  ASA: II  Anesthesia Plan: Epidural   Post-op Pain Management:    Induction:   PONV Risk Score and Plan:   Airway Management Planned:   Additional Equipment:   Intra-op Plan:   Post-operative Plan:   Informed Consent: I have reviewed the patients History and Physical, chart, labs and discussed the procedure including the risks, benefits and alternatives for the proposed anesthesia with the patient or authorized representative who has indicated his/her understanding and acceptance.       Plan Discussed with:   Anesthesia Plan Comments:         Anesthesia Quick Evaluation

## 2019-07-17 NOTE — Lactation Note (Signed)
This note was copied from a baby's chart. Lactation Consultation Note Baby 4 hrs old. Spoke w/mom on the phone since she is + Covid inquiring if she had BF her other 2 children. Mom stated she didn't because of latching issues. Mom states she has inverted nipples had to use a NS when she tried. Mom stated she had low milk supply as well. Suggested to mom to use DEBP every 3 hrs. Mom stated that would be great.  Mom stated baby has no interest in BF at this time and is spitting up some.  Explained normal at this time, that [place] would come visit mom later in the night to see if baby is getting hungry. Discussed giving mom shells and assessing for NS when LC comes. Encouraged to rest while she can.  Reported to RN asking her to set up DEBP for mom.  Patient Name: Stacey Cole SLHTD'S Date: 07/17/2019     Maternal Data    Feeding Feeding Type: Breast Fed  LATCH Score                   Interventions    Lactation Tools Discussed/Used     Consult Status      Theodoro Kalata 07/17/2019, 10:27 PM

## 2019-07-18 LAB — RPR: RPR Ser Ql: NONREACTIVE

## 2019-07-18 LAB — CBC
HCT: 29.7 % — ABNORMAL LOW (ref 36.0–46.0)
Hemoglobin: 9 g/dL — ABNORMAL LOW (ref 12.0–15.0)
MCH: 22.5 pg — ABNORMAL LOW (ref 26.0–34.0)
MCHC: 30.3 g/dL (ref 30.0–36.0)
MCV: 74.3 fL — ABNORMAL LOW (ref 80.0–100.0)
Platelets: 295 10*3/uL (ref 150–400)
RBC: 4 MIL/uL (ref 3.87–5.11)
RDW: 15.3 % (ref 11.5–15.5)
WBC: 13.6 10*3/uL — ABNORMAL HIGH (ref 4.0–10.5)
nRBC: 0 % (ref 0.0–0.2)

## 2019-07-18 NOTE — Lactation Note (Signed)
This note was copied from a baby's chart. Lactation Consultation Note Baby 44 hrs old. Mom was latching baby when Spooner entered rm. Praised mom. Baby was smacking. Mom trying to latch in cradle position. Mom has short shaft everted nipples. Told mom her nipples weren't inverted just short shaft. Compressible. Told mom at this time [facility] doesn't feel she needs a NS. LC took #20, 24 NS into rm. Gave mom shells to wear.  RN had set up DEBP. Encouraged mom to pump Q 3hrs, then hand express afterwards. Newborn behavior, feeding habits, STS, I&O, pumping, milk storage, positioning, support, breast massage, supply and demand discussed.   Mom stated that she couldn't latch her 1st child d/t inverted nipples, mom BF her 2nd child for 3 months but had to supplement d/t low milk supply.  Mom wants to BF this baby, asking questions how to produce more milk. Mom stated she could never pump more than 1 oz. Encouraged mom to pump every 3 hrs for milk induction. Mom agreed.  Assisted in football position, using support. Answered questions mom had.  Baby BF well, occasional swallow, not real often. Hand expression taught w/dopts of colostrum noted. Mom excited. Praised mom and encouraged to cont. Feeding STS. Lactation brochure given. Encouraged to call for questions or assistance.  Patient Name: Stacey Cole [id number] Date: 07/18/2019 Reason for consult: Initial assessment;Early term 37-38.6wks   Maternal Data Has patient been taught Hand Expression?: Yes Does the patient have breastfeeding experience prior to this delivery?: Yes  Feeding Feeding Type: Breast Fed  LATCH Score Latch: Grasps breast easily, tongue down, lips flanged, rhythmical sucking.  Audible Swallowing: A few with stimulation  Type of Nipple: Everted at rest and after stimulation  Comfort (Breast/Nipple): Soft / non-tender  Hold (Positioning): Assistance needed to correctly position infant at breast and maintain  latch.  LATCH Score: 8  Interventions Interventions: Breast feeding basics reviewed;Adjust position;Assisted with latch;Support pillows;DEBP;Skin to skin;Position options;Breast massage;Hand express;Breast compression;Shells  Lactation Tools Discussed/Used Tools: Shells;Pump Shell Type: Inverted Breast pump type: Double-Electric Breast Pump WIC Program: No Pump Review: Setup, frequency, and cleaning;Milk Storage Initiated by:: RN Date initiated:: 07/17/19   Consult Status Consult Status: Follow-up Date: 07/19/19 Follow-up type: In-patient    Theodoro Kalata 07/18/2019, 4:26 AM

## 2019-07-18 NOTE — Progress Notes (Signed)
Post Partum Day 1 Subjective: no complaints, up ad lib, voiding and tolerating PO  Denies any COVID symptoms.   Objective: Blood pressure (!) 106/58, pulse 63, temperature 98 F (36.7 C), temperature source Oral, resp. rate 18, height 5\' 5"  (1.651 m), weight 91.6 kg, last menstrual period 09/29/2018, SpO2 100 %, unknown if currently breastfeeding.  Physical Exam:  General: alert, cooperative and appears stated age  Lungs: NWOB, clear.  CV: RRR Lochia: appropriate Uterine Fundus: firm Incision: healing well, no significant drainage, no dehiscence, no significant erythema DVT Evaluation: No evidence of DVT seen on physical exam. Negative Homan's sign. No cords or calf tenderness. No significant calf/ankle edema.  Recent Labs    07/17/19 1514 07/18/19 0542  HGB 9.9* 9.0*  HCT 33.4* 29.7*    Assessment/Plan: Plan for discharge tomorrow PPD#1 s/p uncomplicated NSVD. From pp standpoint, doing well and meeting all goals thus far.   Breastfeeding with mask  COVID positive, s/p COVID in June w/minimal symptoms. Per ID, treating as new infection. NO s/s of COVID by pt complaints, vs, or exam. Continue precautions.  Per hospital protocol, CIRC unable to be done inpatient.    LOS: 1 day   Stacey Cole 07/18/2019, 9:43 AM

## 2019-07-18 NOTE — Progress Notes (Signed)
MOB was referred for history of depression/anxiety.  * Referral screened out by Clinical Social Worker because none of the following criteria appear to apply:  ~ History of anxiety/depression during this pregnancy, or of post-partum depression following prior delivery. ~ Diagnosis of anxiety and/or depression within last 3 years OR * MOB's symptoms currently being treated with medication and/or therapy. Per [facility] records, MOB's anxiety/depression is well controlled on Lexapro, which she is currently taking.   Please contact the Clinical Social Worker if needs arise, by MOB request, or if MOB scores greater than 9/yes to question 10 on Edinburgh Postpartum Depression Screen.  Powell Halbert, LCSWA  [facility] [phone]  

## 2019-07-19 MED ORDER — IBUPROFEN 600 MG PO TABS: 600.0000 mg | ORAL_TABLET | Freq: Four times a day (QID) | ORAL | 0 refills | Status: DC

## 2019-07-19 NOTE — Discharge Summary (Signed)
Obstetric Discharge Summary Reason for Admission: onset of labor Prenatal Procedures: none Intrapartum Procedures: spontaneous vaginal delivery Postpartum Procedures: none Complications-Operative and Postpartum: Diagnosed with suspected COVID re-infection (initial dx in June) Hemoglobin  Date Value Ref Range Status  07/18/2019 9.0 (L) 12.0 - 15.0 g/dL Final  04/19/2013 13.8 g/dL Final   HCT  Date Value Ref Range Status  07/18/2019 29.7 (L) 36.0 - 46.0 % Final  04/19/2013 40 % Final    Physical Exam:  General: alert, cooperative and appears stated age 28: appropriate Uterine Fundus: firm Incision: n/a DVT Evaluation: No evidence of DVT seen on physical exam. Negative Homan's sign. No cords or calf tenderness.  Discharge Diagnoses: Term Pregnancy-delivered and COVID  Discharge Information: Date: 07/19/2019 Activity: pelvic rest Diet: routine Medications: PNV and Ibuprofen Condition: stable Instructions: refer to practice specific booklet Discharge to: home   Newborn Data: Live born female  Birth Weight: 7 lb 0.5 oz (3190 g) APGAR: 8, 9  Newborn Delivery   Birth date/time: 07/17/2019 17:53:00 Delivery type: Vaginal, Spontaneous      Home with mother.  Linda Hedges 07/19/2019, 9:55 AM

## 2019-07-19 NOTE — Lactation Note (Signed)
This note was copied from a baby's chart. Lactation Consultation Note  Patient Name: Stacey Cole Date: 07/19/2019 Reason for consult: Follow-up assessment Baby is 39 hours old/8% weight loss.  This is mom's first time breastfeeding.  She has started to supplement with formula and feels she will pump and bottle feed at home.  She does have a pump.  Instructed to pump every 3 hours for 15 minutes.  Discussed milk coming to volume and the prevention and treatment of engorgement.  Reviewed outpatient services and encouraged to call prn.  Maternal Data    Feeding Feeding Type: Bottle Fed - Formula  LATCH Score                   Interventions    Lactation Tools Discussed/Used     Consult Status Consult Status: Complete Follow-up type: Call as needed    Ave Filter 07/19/2019, 9:29 AM

## 2019-07-19 NOTE — Discharge Instructions (Signed)
Call MD for T>100.4, heavy vaginal bleeding, severe abdominal pain, or respiratory distress.  Call office to schedule postpartum visit in 6 weeks.  Pelvic rest x 6 weeks.   

## 2019-07-19 NOTE — Progress Notes (Signed)
Post Partum Day 2 Subjective: no complaints, up ad lib, voiding and tolerating PO.  No cough, chest pain, shortness of breath.  Objective: Blood pressure (!) 121/54, pulse 67, temperature 97.7 F (36.5 C), temperature source Oral, resp. rate 18, height 5\' 5"  (1.651 m), weight 91.6 kg, last menstrual period 09/29/2018, SpO2 98 %, unknown if currently breastfeeding.  Physical Exam:  General: alert, cooperative and appears stated age Lochia: appropriate Uterine Fundus: firm Incision: n/a DVT Evaluation: No evidence of DVT seen on physical exam. Negative Homan's sign. No cords or calf tenderness.  Recent Labs    07/17/19 1514 07/18/19 0542  HGB 9.9* 9.0*  HCT 33.4* 29.7*    Assessment/Plan: Discharge home and Breastfeeding  COVID positive-BF with mask.  Outpt circ.     LOS: 2 days   Linda Hedges 07/19/2019, 9:52 AM

## 2019-07-20 ENCOUNTER — Other Ambulatory Visit: Payer: Self-pay

## 2019-07-20 ENCOUNTER — Inpatient Hospital Stay (HOSPITAL_COMMUNITY): Admission: AD | Admit: 2019-07-20 | Payer: 59 | Source: Home / Self Care | Admitting: Obstetrics and Gynecology

## 2019-07-20 ENCOUNTER — Inpatient Hospital Stay (HOSPITAL_COMMUNITY): Payer: 59

## 2019-07-20 DIAGNOSIS — Z20822 Contact with and (suspected) exposure to covid-19: Secondary | ICD-10-CM

## 2019-07-22 LAB — NOVEL CORONAVIRUS, NAA: SARS-CoV-2, NAA: NOT DETECTED

## 2019-11-23 ENCOUNTER — Other Ambulatory Visit: Payer: 59

## 2019-12-03 IMAGING — US US MFM OB TRANSVAGINAL
1 series · 12 of 12 positions shown · non-contrast
Comparison: none

[Series 1: us mfm ob transvaginal · 12 of 12 slices shown]
[im 1/12]
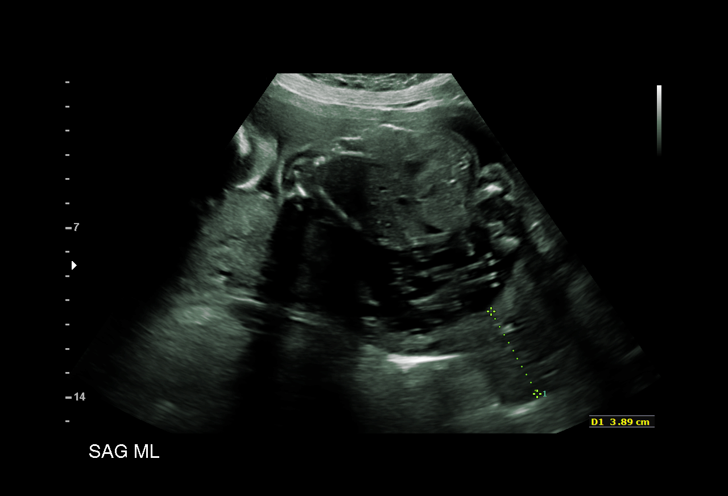
[im 2/12]
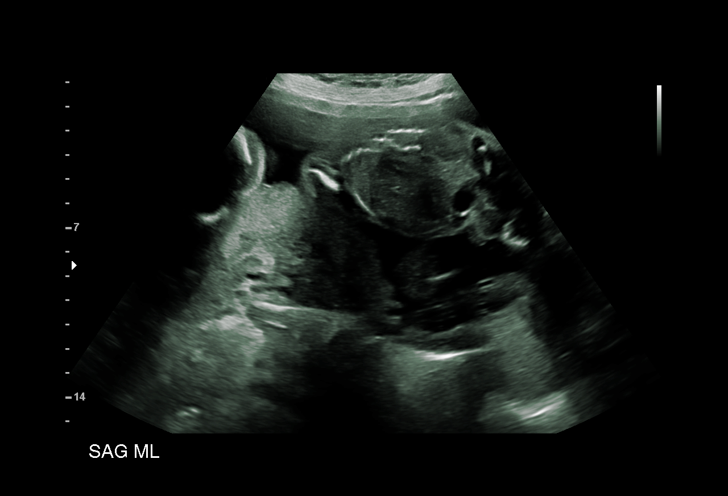
[im 3/12]
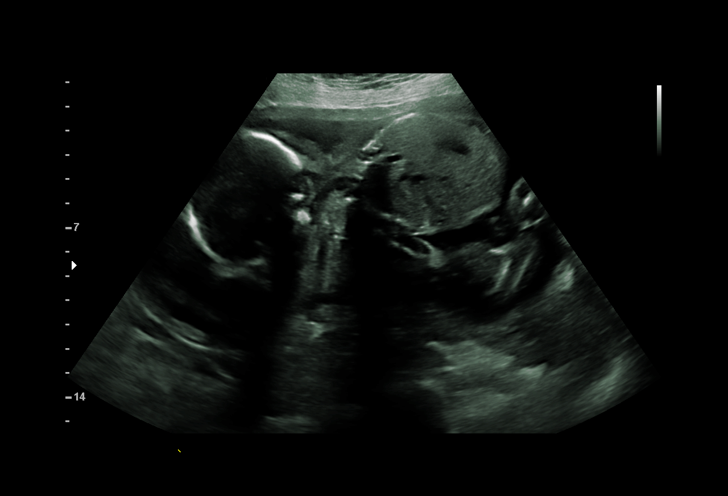
[im 4/12]
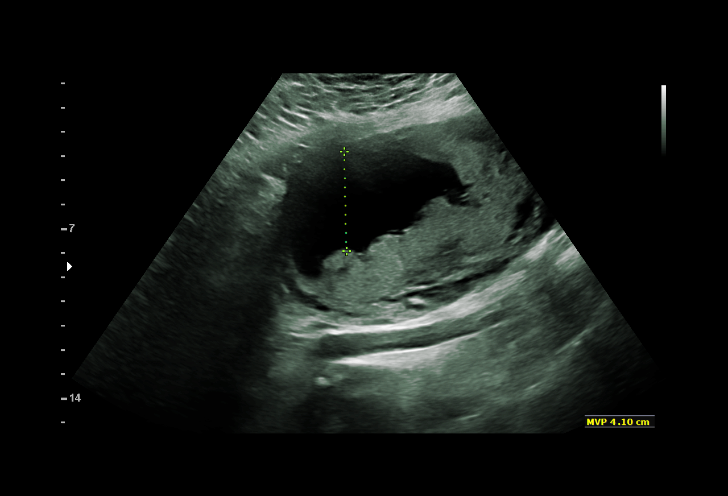
[im 5/12]
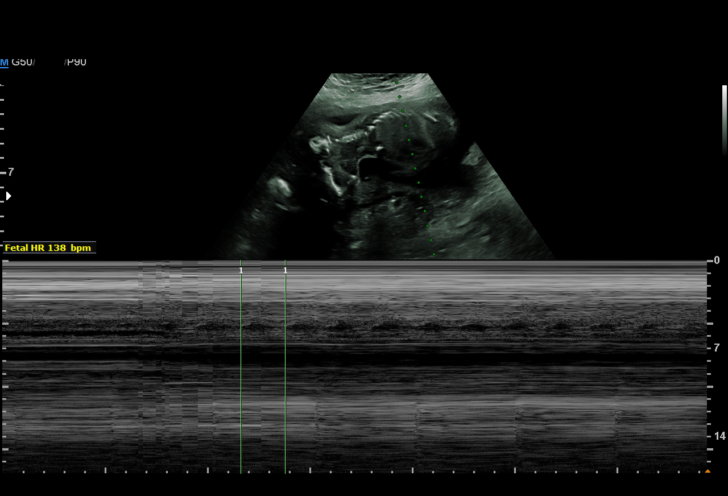
[im 6/12]
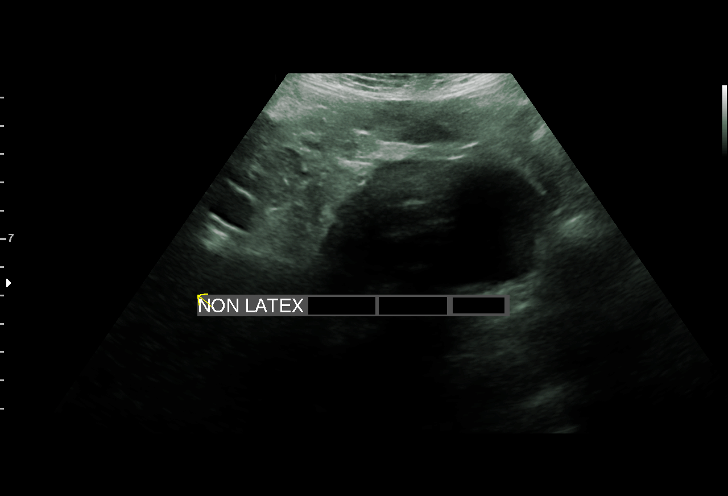
[im 7/12]
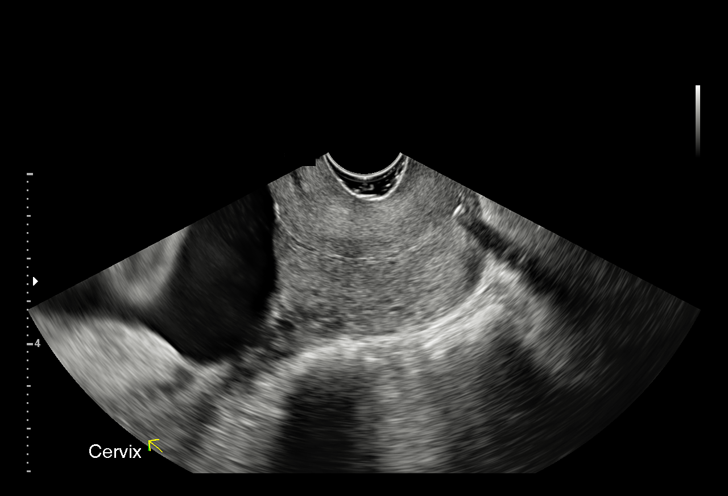
[im 8/12]
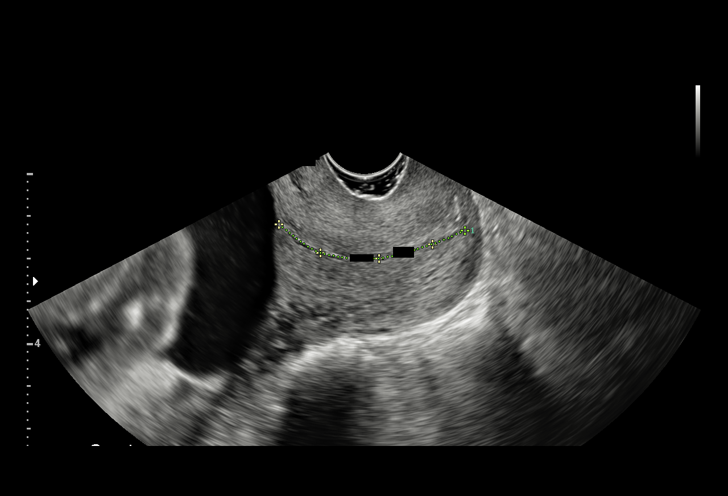
[im 9/12]
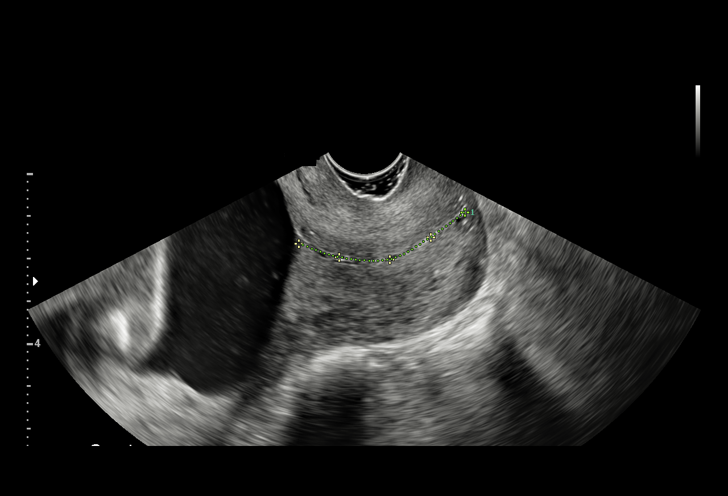
[im 10/12]
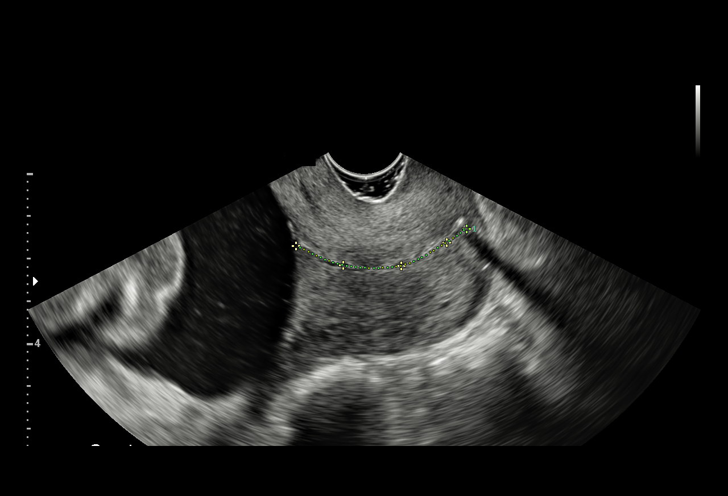
[im 11/12]
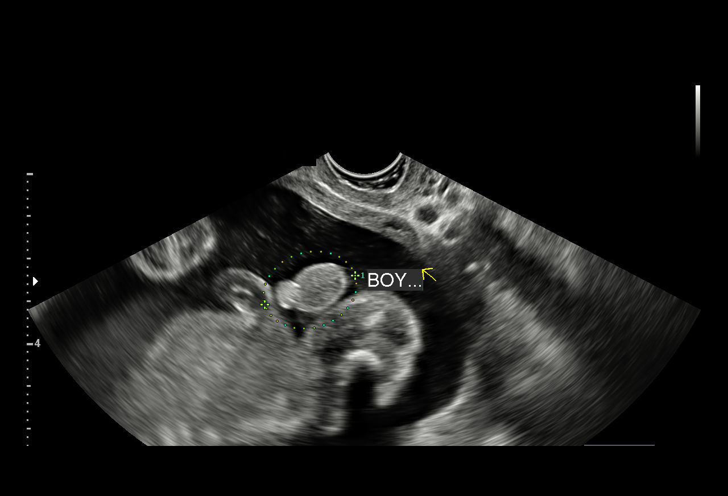
[im 12/12]
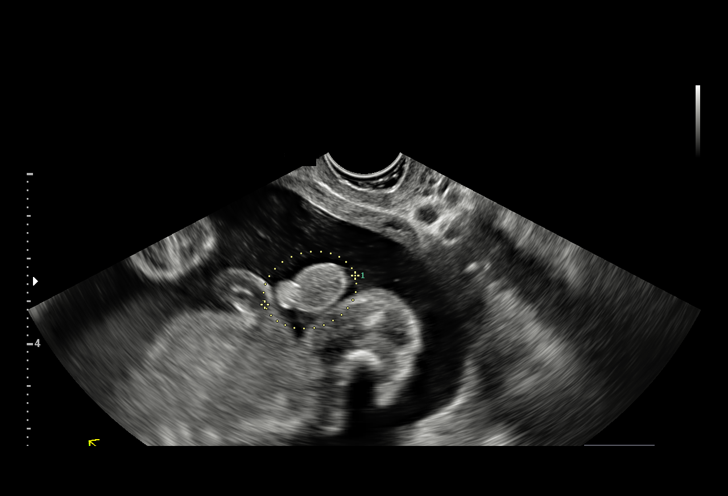

[12 of 12 positions shown; findings below may reference images not displayed]

----------------------------------------------------------------------

 ----------------------------------------------------------------------
Indications

  Pelvic pain affecting pregnancy in second
  trimester
  21 weeks gestation of pregnancy
  Encounter for cervical length
 ----------------------------------------------------------------------
Fetal Evaluation

 Num Of Fetuses:         1
 Fetal Heart Rate(bpm):  138
 Cardiac Activity:       Observed
 Presentation:           Breech

 Amniotic Fluid
 AFI FV:      Within normal limits

                             Largest Pocket(cm)

OB History

 Gravidity:    4         Term:   2
 TOP:          1        Living:  2
Gestational Age

 Clinical EDD:  21w 2d                                        EDD:   07/27/19
 Best:          21w 2d     Det. By:  Clinical EDD             EDD:   07/27/19
Cervix Uterus Adnexa

 Cervix
 Length:            4.3  cm.
 Normal appearance by transvaginal scan
Impression

 Patient was evaluated for c/o pelvic pressure.
 Amniotic fluid is normal and good fetal activity is seen.
 Transvaginal ultrasound was performed to evaluate the
 cervix. On transvaginal scan, the cervix measures 4.3 cm,
 which is normal.
                 [place], [place]

## 2024-07-01 ENCOUNTER — Ambulatory Visit
Admission: EM | Admit: 2024-07-01 | Discharge: 2024-07-01 | Disposition: A | Discharge status: Home / Self Care | Attending: Family Medicine | Admitting: Family Medicine

## 2024-07-01 DIAGNOSIS — J340 Abscess, furuncle and carbuncle of nose: Secondary | ICD-10-CM

## 2024-07-01 MED ORDER — IBUPROFEN 600 MG PO TABS: 600.0000 mg | ORAL_TABLET | Freq: Four times a day (QID) | ORAL | 0 refills | Status: AC | PRN

## 2024-07-01 MED ORDER — AMOXICILLIN-POT CLAVULANATE 875-125 MG PO TABS: 1.0000 | ORAL_TABLET | Freq: Two times a day (BID) | ORAL | 0 refills | Status: AC

## 2024-07-01 MED ORDER — FLUCONAZOLE 150 MG PO TABS: 150.0000 mg | ORAL_TABLET | ORAL | 0 refills | Status: AC

## 2024-07-01 NOTE — ED Triage Notes (Signed)
 Pt present with c/o nose pain. Pt states she swelling on the lt nostril. States she has pain when touching it and blowing her nose.

## 2024-07-01 NOTE — ED Provider Notes (Signed)
 [facility]  Note:  This document was prepared using Conservation officer, historic buildings and may include unintentional dictation errors.  MRN: [id number] DOB: [birth year]  Subjective:   Stacey Cole is a 33 y.o. female presenting for several day history of persistent and worsening nasal pain now experiencing more difficulty with breathing from the pain and external swelling.  Reports that her sons have previously had nasal abscesses requiring oral treatment.  No current facility-administered medications for this encounter.  Current Outpatient Medications:    escitalopram  (LEXAPRO ) 10 MG tablet, Take 10 mg by mouth daily. , Disp: , Rfl: 8   ibuprofen  (ADVIL ) 600 MG tablet, Take 1 tablet (600 mg total) by mouth every 6 (six) hours., Disp: 30 tablet, Rfl: 0   Prenatal Vit-Fe Fumarate-FA (PRENATAL MULTIVITAMIN) TABS tablet, Take 1 tablet by mouth daily with breakfast. , Disp: , Rfl:    No Known Allergies  Past Medical History:  Diagnosis Date   Anxiety    Chlamydia    GERD (gastroesophageal reflux disease)      Past Surgical History:  Procedure Laterality Date   CHOLECYSTECTOMY     LAPAROSCOPIC GASTRIC BAND REMOVAL WITH LAPAROSCOPIC GASTRIC SLEEVE RESECTION      Family History  Problem Relation Age of Onset   Hypertension Mother    Diabetes Father    Heart disease Maternal Aunt    Diabetes Paternal Aunt    Diabetes Paternal Uncle    Heart disease Maternal Grandfather     Social History   Tobacco Use   Smoking status: Never   Smokeless tobacco: Never  Vaping Use   Vaping status: Never Used  Substance Use Topics   Alcohol use: No   Drug use: No    ROS   Objective:   Vitals: BP 114/70   Pulse 81   Temp 98.9 F (37.2 C)   Resp 17   LMP  (LMP Unknown)   SpO2 97%   Breastfeeding No   Physical Exam Constitutional:      General: She is not in acute distress.    Appearance: Normal appearance. She is well-developed. She is not  ill-appearing, toxic-appearing or diaphoretic.  HENT:     Head: Normocephalic and atraumatic.     Nose: Nasal tenderness and mucosal edema present.      Mouth/Throat:     Mouth: Mucous membranes are moist.  Eyes:     General: No scleral icterus.       Right eye: No discharge.        Left eye: No discharge.     Extraocular Movements: Extraocular movements intact.  Cardiovascular:     Rate and Rhythm: Normal rate.  Pulmonary:     Effort: Pulmonary effort is normal.  Skin:    General: Skin is warm and dry.  Neurological:     General: No focal deficit present.     Mental Status: She is alert and oriented to person, place, and time.  Psychiatric:        Mood and Affect: Mood normal.        Behavior: Behavior normal.     Assessment and Plan :   PDMP not reviewed this encounter.  1. Furuncle of nose    Recommend managing for a furuncle of the nasal cavity with Augmentin, ibuprofen  for pain and inflammation.  Fluconazole for antibiotic associated yeast infection.  Counseled patient on potential for adverse effects with medications prescribed/recommended today, ER and return-to-clinic precautions discussed, patient verbalized understanding.  Christopher Savannah, [place] 07/01/24 1306
